# Patient Record
Sex: Male | Born: 1990 | Race: White | Hispanic: No | Marital: Single | Smoking: Never smoker
Health system: Southern US, Community
[De-identification: ages and names within clinical notes are randomized; demographics above are authoritative.]

## PROBLEM LIST (undated history)

## (undated) DIAGNOSIS — L0291 Cutaneous abscess, unspecified: Secondary | ICD-10-CM

---
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
## 2011-06-08 LAB — STREP A DNA PROBE: Group A Strep Probe: NEGATIVE

## 2014-05-10 ENCOUNTER — Encounter (HOSPITAL_COMMUNITY): Payer: Self-pay | Admitting: Emergency Medicine

## 2014-05-10 ENCOUNTER — Ambulatory Visit (HOSPITAL_COMMUNITY)
Admission: EM | Admit: 2014-05-10 | Discharge: 2014-05-11 | Disposition: A | Discharge status: Home / Self Care | Payer: Self-pay | Attending: Emergency Medicine | Admitting: Emergency Medicine

## 2014-05-10 DIAGNOSIS — L02511 Cutaneous abscess of right hand: Secondary | ICD-10-CM

## 2014-05-10 DIAGNOSIS — Y998 Other external cause status: Secondary | ICD-10-CM | POA: Insufficient documentation

## 2014-05-10 DIAGNOSIS — L03019 Cellulitis of unspecified finger: Principal | ICD-10-CM

## 2014-05-10 DIAGNOSIS — L02519 Cutaneous abscess of unspecified hand: Secondary | ICD-10-CM | POA: Insufficient documentation

## 2014-05-10 DIAGNOSIS — Y9389 Activity, other specified: Secondary | ICD-10-CM | POA: Insufficient documentation

## 2014-05-10 DIAGNOSIS — Y92009 Unspecified place in unspecified non-institutional (private) residence as the place of occurrence of the external cause: Secondary | ICD-10-CM | POA: Insufficient documentation

## 2014-05-10 LAB — CBC WITH DIFFERENTIAL/PLATELET
BASOS PCT: 0 % (ref 0–1)
Basophils Absolute: 0 10*3/uL (ref 0.0–0.1)
EOS PCT: 1 % (ref 0–5)
Eosinophils Absolute: 0.1 10*3/uL (ref 0.0–0.7)
HEMATOCRIT: 42.9 % (ref 39.0–52.0)
HEMOGLOBIN: 14.4 g/dL (ref 13.0–17.0)
Lymphocytes Relative: 13 % (ref 12–46)
Lymphs Abs: 1.4 10*3/uL (ref 0.7–4.0)
MCH: 31.5 pg (ref 26.0–34.0)
MCHC: 33.6 g/dL (ref 30.0–36.0)
MCV: 93.9 fL (ref 78.0–100.0)
MONO ABS: 1 10*3/uL (ref 0.1–1.0)
MONOS PCT: 9 % (ref 3–12)
NEUTROS ABS: 7.9 10*3/uL — AB (ref 1.7–7.7)
Neutrophils Relative %: 77 % (ref 43–77)
Platelets: 168 10*3/uL (ref 150–400)
RBC: 4.57 MIL/uL (ref 4.22–5.81)
RDW: 11.9 % (ref 11.5–15.5)
WBC: 10.3 10*3/uL (ref 4.0–10.5)

## 2014-05-10 LAB — COMPREHENSIVE METABOLIC PANEL
ALBUMIN: 3.7 g/dL (ref 3.5–5.2)
ALT: 16 U/L (ref 0–53)
ANION GAP: 11 (ref 5–15)
AST: 20 U/L (ref 0–37)
Alkaline Phosphatase: 74 U/L (ref 39–117)
BILIRUBIN TOTAL: 0.7 mg/dL (ref 0.3–1.2)
BUN: 14 mg/dL (ref 6–23)
CHLORIDE: 101 meq/L (ref 96–112)
CO2: 28 meq/L (ref 19–32)
CREATININE: 1.06 mg/dL (ref 0.50–1.35)
Calcium: 8.8 mg/dL (ref 8.4–10.5)
GFR calc Af Amer: >90 mL/min (ref >90)
Glucose, Bld: 85 mg/dL (ref 70–99)
Potassium: 4.2 mEq/L (ref 3.7–5.3)
Sodium: 140 mEq/L (ref 137–147)
Total Protein: 7.1 g/dL (ref 6.0–8.3)

## 2014-05-10 MED ORDER — MORPHINE SULFATE 4 MG/ML IJ SOLN
4.0000 mg | Freq: Once | INTRAMUSCULAR | Status: AC
  Administered 2014-05-10: 4 mg via INTRAVENOUS
  Filled 2014-05-10: qty 1

## 2014-05-10 MED ORDER — SODIUM CHLORIDE 0.9 % IV BOLUS (SEPSIS)
1000.0000 mL | Freq: Once | INTRAVENOUS | Status: AC
  Administered 2014-05-10: 1000 mL via INTRAVENOUS

## 2014-05-10 NOTE — ED Provider Notes (Signed)
CSN: [id number]     Arrival date & time 05/10/14  1947 History   First MD Initiated Contact with Patient 05/10/14 2255     Chief Complaint  Patient presents with  . Wound Infection     (Consider location/radiation/quality/duration/timing/severity/associated sxs/prior Treatment) HPI Comments: 23 year old male presents to the emergency department for further evaluation of pain to his right index finger. Patient states that he believes he was bit by a spider 5 days ago. He states he went to urgent care 2 days ago who placed the patient on Bactrim. He states he has been taking his antibiotics for 48 hours if no improvement. Patient does note he took a "sterile needle" and punctured his right index finger. He endorses some purulent drainage from the site, but states that redness and swelling worsened shortly after this. He has been taking motrin for pain control without relief. He denies fever, red streaking, numbness, weakness, and pallor to distal R index finger. Tetanus last in 2012. Patient is R hand dominant.  The history is provided by the patient. No language interpreter was used.    History reviewed. No pertinent past medical history. History reviewed. No pertinent past surgical history. No family history on file. History  Substance Use Topics  . Smoking status: Never Smoker   . Smokeless tobacco: Not on file  . Alcohol Use: No    Review of Systems  Constitutional: Negative for fever.  Musculoskeletal: Positive for myalgias.  Skin: Positive for color change and wound.  Neurological: Negative for weakness and numbness.  All other systems reviewed and are negative.    Allergies  Review of patient's allergies indicates no known allergies.  Home Medications   Prior to Admission medications   Medication Sig Start Date End Date Taking? Authorizing Provider  ibuprofen (ADVIL,MOTRIN) 800 MG tablet Take 800 mg by mouth 3 (three) times daily as needed for mild pain.   Yes Historical  Provider, MD  sulfamethoxazole-trimethoprim (BACTRIM DS) 800-160 MG per tablet Take 1 tablet by mouth every 12 (twelve) hours.   Yes Historical Provider, MD   BP 124/51  Pulse 85  Temp(Src) 98.8 F (37.1 C) (Oral)  Resp 14  Ht _0  (1.803 m)  Wt 192 lb (87.091 kg)  BMI 26.79 kg/m2  SpO2 100%  Physical Exam  Nursing note and vitals reviewed. Constitutional: He is oriented to person, place, and time. He appears well-developed and well-nourished. No distress.  Nontoxic/nonseptic appearing  HENT:  Head: Normocephalic and atraumatic.  Eyes: Conjunctivae and EOM are normal. No scleral icterus.  Neck: Normal range of motion.  Cardiovascular: Normal rate, regular rhythm and intact distal pulses.   Distal radial pulse 2+ in RUE. Capillary refill brisk in all digits of R hand.  Pulmonary/Chest: Effort normal. No respiratory distress.  Musculoskeletal: He exhibits tenderness.  Near full PROM of R index finger with limited AROM secondary to swelling and pain. Patient with swelling and erythema from MCP joint of 2nd digit of R hand extending to PIP joint. Punctate area weeping purulent drainage. Area is warm and tender to touch. See images below.  Neurological: He is alert and oriented to person, place, and time. He exhibits normal muscle tone. Coordination normal.  No gross sensory deficits appreciated.  Skin: Skin is warm and dry. No rash noted. He is not diaphoretic. No erythema. No pallor.  Psychiatric: He has a normal mood and affect. His behavior is normal.    ED Course  Procedures (including critical care time) Labs Review Labs  Reviewed  CBC WITH DIFFERENTIAL - Abnormal; Notable for the following:    Neutro Abs 7.9 (*)    All other components within normal limits  COMPREHENSIVE METABOLIC PANEL  SEDIMENTATION RATE  C-REACTIVE PROTEIN   Imaging Review No results found.   EKG Interpretation None         MDM   Final diagnoses:  Abscess of finger of right hand     23 year old male presents to the emergency department for worsening redness and swelling of his right index finger. Physical exam findings consistent with abscess of right index finger. Patient neurovascularly intact. ESR and CRP pending. No leukocytosis. Case discussed with orthopedic hand surgeon, Dr. Fredna Dow, who will see the patient; patient to require OR I&D for abscess. Will withhold Unasyn as cultures to be taken during OR I&D.   Filed Vitals:   05/10/14 2020 05/10/14 2244 05/10/14 2300 05/10/14 2342  BP: 129/98 141/86 130/78 124/51  Pulse: 75 83 86 85  Temp: 98.1 F (36.7 C) 98.8 F (37.1 C)    TempSrc: Oral Oral    Resp: 18 14    Height: _0  (1.803 m)     Weight: 192 lb (87.091 kg)     SpO2: 100% 100% 100% 100%       Antonietta Breach, PA-C 05/11/14 0016

## 2014-05-10 NOTE — ED Notes (Signed)
Pt. Reports spider bite x5 days ago to right index finger. States that he went to urgent care and was put on abx but it is not helping. Finger red, swollen and warm to touch with drainage at puncture site, hand is also swollen and warm to touch.

## 2014-05-10 NOTE — ED Notes (Signed)
The pt was bitten by a spider 5 days ago.  He was  Seen at [facility]  2 days ago.  He was given antibiotics which he is still taking.   Rt index finger was the bite site.  He has redness with swelling and this am  The pt was aqueezinf the area and getting drainage from it.  The swelling is worse and more inflammed now

## 2014-05-10 NOTE — ED Notes (Signed)
Kelly PA at bedside with pt and family, explained time frame and plan for pt.

## 2014-05-11 ENCOUNTER — Emergency Department (HOSPITAL_COMMUNITY): Payer: Self-pay | Admitting: Certified Registered"

## 2014-05-11 ENCOUNTER — Encounter (HOSPITAL_COMMUNITY): Payer: Self-pay | Admitting: Certified Registered"

## 2014-05-11 ENCOUNTER — Encounter (HOSPITAL_COMMUNITY)
Admission: EM | Disposition: A | Discharge status: Home / Self Care | Payer: Self-pay | Source: Home / Self Care | Attending: Emergency Medicine

## 2014-05-11 HISTORY — PX: I&D EXTREMITY: SHX5045

## 2014-05-11 LAB — C-REACTIVE PROTEIN: CRP: 3.8 mg/dL — ABNORMAL HIGH (ref <0.60)

## 2014-05-11 LAB — SEDIMENTATION RATE: SED RATE: 7 mm/h (ref 0–16)

## 2014-05-11 SURGERY — IRRIGATION AND DEBRIDEMENT EXTREMITY
Anesthesia: Regional | Site: Finger | Laterality: Right

## 2014-05-11 MED ORDER — PROPOFOL 10 MG/ML IV BOLUS
INTRAVENOUS | Status: AC
  Filled 2014-05-11: qty 20

## 2014-05-11 MED ORDER — FENTANYL CITRATE 0.05 MG/ML IJ SOLN: 25.0000 ug | INTRAMUSCULAR | Status: DC | PRN

## 2014-05-11 MED ORDER — VANCOMYCIN HCL IN DEXTROSE 1-5 GM/200ML-% IV SOLN
INTRAVENOUS | Status: AC
  Filled 2014-05-11: qty 200

## 2014-05-11 MED ORDER — SODIUM CHLORIDE 0.9 % IR SOLN: Status: DC | PRN

## 2014-05-11 MED ORDER — BUPIVACAINE HCL (PF) 0.25 % IJ SOLN
INTRAMUSCULAR | Status: AC
  Filled 2014-05-11: qty 30

## 2014-05-11 MED ORDER — SODIUM CHLORIDE 0.9 % IJ SOLN
INTRAMUSCULAR | Status: AC
  Filled 2014-05-11: qty 10

## 2014-05-11 MED ORDER — ONDANSETRON HCL 4 MG/2ML IJ SOLN
INTRAMUSCULAR | Status: DC | PRN
  Administered 2014-05-11: 4 mg via INTRAVENOUS

## 2014-05-11 MED ORDER — PROPOFOL 10 MG/ML IV BOLUS
INTRAVENOUS | Status: DC | PRN
  Administered 2014-05-11: 30 mg via INTRAVENOUS

## 2014-05-11 MED ORDER — DEXAMETHASONE SODIUM PHOSPHATE 4 MG/ML IJ SOLN
INTRAMUSCULAR | Status: AC
  Filled 2014-05-11: qty 1

## 2014-05-11 MED ORDER — ONDANSETRON HCL 4 MG/2ML IJ SOLN
INTRAMUSCULAR | Status: AC
  Filled 2014-05-11: qty 2

## 2014-05-11 MED ORDER — MIDAZOLAM HCL 2 MG/2ML IJ SOLN
INTRAMUSCULAR | Status: AC
  Filled 2014-05-11: qty 2

## 2014-05-11 MED ORDER — OXYCODONE-ACETAMINOPHEN 5-325 MG PO TABS
ORAL_TABLET | ORAL | Status: DC
Start: 2014-05-11 — End: 2014-07-26

## 2014-05-11 MED ORDER — LIDOCAINE HCL (CARDIAC) 20 MG/ML IV SOLN
INTRAVENOUS | Status: AC
  Filled 2014-05-11: qty 5

## 2014-05-11 MED ORDER — MIDAZOLAM HCL 2 MG/2ML IJ SOLN
INTRAMUSCULAR | Status: DC | PRN
  Administered 2014-05-11: 2 mg via INTRAVENOUS

## 2014-05-11 MED ORDER — SULFAMETHOXAZOLE-TRIMETHOPRIM 800-160 MG PO TABS: 1.0000 | ORAL_TABLET | Freq: Two times a day (BID) | ORAL | Status: AC

## 2014-05-11 MED ORDER — VANCOMYCIN HCL IN DEXTROSE 1-5 GM/200ML-% IV SOLN
1000.0000 mg | Freq: Once | INTRAVENOUS | Status: AC
  Administered 2014-05-11: 1000 mg via INTRAVENOUS
  Filled 2014-05-11 (×2): qty 200

## 2014-05-11 MED ORDER — LACTATED RINGERS IV SOLN
INTRAVENOUS | Status: DC | PRN
  Administered 2014-05-11: 02:00:00 via INTRAVENOUS

## 2014-05-11 MED ORDER — SODIUM CHLORIDE 0.9 % IR SOLN
Status: DC | PRN
  Administered 2014-05-11: 1000 mL

## 2014-05-11 MED ORDER — 0.9 % SODIUM CHLORIDE (POUR BTL) OPTIME
TOPICAL | Status: DC | PRN
  Administered 2014-05-11: 1000 mL

## 2014-05-11 MED ORDER — SUFENTANIL CITRATE 50 MCG/ML IV SOLN
INTRAVENOUS | Status: AC
  Filled 2014-05-11: qty 1

## 2014-05-11 MED ORDER — ROPIVACAINE HCL 5 MG/ML IJ SOLN
INTRAMUSCULAR | Status: DC | PRN
  Administered 2014-05-11: 20 mL via PERINEURAL

## 2014-05-11 MED ORDER — SUFENTANIL CITRATE 50 MCG/ML IV SOLN
INTRAVENOUS | Status: DC | PRN
Start: 2014-05-11 — End: 2014-05-11
  Administered 2014-05-11: 10 ug via INTRAVENOUS

## 2014-05-11 MED ORDER — LIDOCAINE HCL (CARDIAC) 20 MG/ML IV SOLN
INTRAVENOUS | Status: DC | PRN
Start: 2014-05-11 — End: 2014-05-11
  Administered 2014-05-11: 60 mg via INTRAVENOUS

## 2014-05-11 MED ORDER — DEXAMETHASONE SODIUM PHOSPHATE 4 MG/ML IJ SOLN
INTRAMUSCULAR | Status: DC | PRN
  Administered 2014-05-11: 4 mg via INTRAVENOUS

## 2014-05-11 MED ORDER — KETOROLAC TROMETHAMINE 30 MG/ML IJ SOLN
30.0000 mg | Freq: Once | INTRAMUSCULAR | Status: AC
  Administered 2014-05-11: 30 mg via INTRAVENOUS
  Filled 2014-05-11: qty 1

## 2014-05-11 SURGICAL SUPPLY — 60 items
BANDAGE COBAN STERILE 2 (GAUZE/BANDAGES/DRESSINGS) IMPLANT
BANDAGE ELASTIC 3 VELCRO ST LF (GAUZE/BANDAGES/DRESSINGS) ×6 IMPLANT
BANDAGE ELASTIC 4 VELCRO ST LF (GAUZE/BANDAGES/DRESSINGS) ×3 IMPLANT
BNDG COHESIVE 1X5 TAN STRL LF (GAUZE/BANDAGES/DRESSINGS) IMPLANT
BNDG CONFORM 2 STRL LF (GAUZE/BANDAGES/DRESSINGS) IMPLANT
BNDG CONFORM 3 STRL LF (GAUZE/BANDAGES/DRESSINGS) ×3 IMPLANT
BNDG ESMARK 4X9 LF (GAUZE/BANDAGES/DRESSINGS) ×3 IMPLANT
BNDG GAUZE ELAST 4 BULKY (GAUZE/BANDAGES/DRESSINGS) ×6 IMPLANT
CORDS BIPOLAR (ELECTRODE) ×3 IMPLANT
COVER SURGICAL LIGHT HANDLE (MISCELLANEOUS) ×3 IMPLANT
DECANTER SPIKE VIAL GLASS SM (MISCELLANEOUS) ×3 IMPLANT
DRAIN PENROSE 1/4X12 LTX STRL (WOUND CARE) IMPLANT
DRAPE SURG 17X23 STRL (DRAPES) ×3 IMPLANT
DRSG ADAPTIC 3X8 NADH LF (GAUZE/BANDAGES/DRESSINGS) IMPLANT
DRSG EMULSION OIL 3X3 NADH (GAUZE/BANDAGES/DRESSINGS) ×3 IMPLANT
DRSG PAD ABDOMINAL 8X10 ST (GAUZE/BANDAGES/DRESSINGS) ×6 IMPLANT
GAUZE IODOFORM PACK 1/2 7832 (GAUZE/BANDAGES/DRESSINGS) ×3 IMPLANT
GAUZE SPONGE 4X4 12PLY STRL (GAUZE/BANDAGES/DRESSINGS) ×6 IMPLANT
GAUZE XEROFORM 1X8 LF (GAUZE/BANDAGES/DRESSINGS) ×3 IMPLANT
GLOVE BIO SURGEON STRL SZ7.5 (GLOVE) ×6 IMPLANT
GLOVE BIOGEL PI IND STRL 7.5 (GLOVE) ×1 IMPLANT
GLOVE BIOGEL PI IND STRL 8 (GLOVE) ×1 IMPLANT
GLOVE BIOGEL PI INDICATOR 7.5 (GLOVE) ×2
GLOVE BIOGEL PI INDICATOR 8 (GLOVE) ×2
GLOVE SURG SS PI 7.5 STRL IVOR (GLOVE) ×3 IMPLANT
GOWN STRL REIN XL XLG (GOWN DISPOSABLE) ×3 IMPLANT
HANDPIECE INTERPULSE COAX TIP (DISPOSABLE)
KIT BASIN OR (CUSTOM PROCEDURE TRAY) ×3 IMPLANT
KIT ROOM TURNOVER OR (KITS) ×3 IMPLANT
LOOP VESSEL MAXI BLUE (MISCELLANEOUS) IMPLANT
LOOP VESSEL MINI RED (MISCELLANEOUS) IMPLANT
MANIFOLD NEPTUNE II (INSTRUMENTS) ×3 IMPLANT
NEEDLE HYPO 25X1 1.5 SAFETY (NEEDLE) IMPLANT
NS IRRIG 1000ML POUR BTL (IV SOLUTION) ×3 IMPLANT
PACK ORTHO EXTREMITY (CUSTOM PROCEDURE TRAY) ×3 IMPLANT
PAD ARMBOARD 7.5X6 YLW CONV (MISCELLANEOUS) ×6 IMPLANT
PAD CAST 4YDX4 CTTN HI CHSV (CAST SUPPLIES) ×1 IMPLANT
PADDING CAST COTTON 4X4 STRL (CAST SUPPLIES) ×2
SCRUB BETADINE 4OZ XXX (MISCELLANEOUS) ×3 IMPLANT
SET CYSTO W/LG BORE CLAMP LF (SET/KITS/TRAYS/PACK) ×3 IMPLANT
SET HNDPC FAN SPRY TIP SCT (DISPOSABLE) IMPLANT
SOLUTION BETADINE 4OZ (MISCELLANEOUS) ×3 IMPLANT
SPLINT PLASTER CAST XFAST 3X15 (CAST SUPPLIES) ×1 IMPLANT
SPLINT PLASTER XTRA FASTSET 3X (CAST SUPPLIES) ×2
SPONGE LAP 18X18 X RAY DECT (DISPOSABLE) ×3 IMPLANT
SPONGE LAP 4X18 X RAY DECT (DISPOSABLE) ×3 IMPLANT
SUCTION FRAZIER TIP 10 FR DISP (SUCTIONS) ×3 IMPLANT
SUT ETHILON 4 0 PS 2 18 (SUTURE) ×3 IMPLANT
SUT MON AB 5-0 P3 18 (SUTURE) IMPLANT
SWAB COLLECTION DEVICE MRSA (MISCELLANEOUS) ×3 IMPLANT
SYR CONTROL 10ML LL (SYRINGE) IMPLANT
TOWEL OR 17X24 6PK STRL BLUE (TOWEL DISPOSABLE) ×3 IMPLANT
TOWEL OR 17X26 10 PK STRL BLUE (TOWEL DISPOSABLE) ×3 IMPLANT
TUBE ANAEROBIC SPECIMEN COL (MISCELLANEOUS) ×3 IMPLANT
TUBE CONNECTING 12'X1/4 (SUCTIONS) ×1
TUBE CONNECTING 12X1/4 (SUCTIONS) ×2 IMPLANT
TUBE FEEDING 5FR 15 INCH (TUBING) IMPLANT
UNDERPAD 30X30 INCONTINENT (UNDERPADS AND DIAPERS) ×3 IMPLANT
WATER STERILE IRR 1000ML POUR (IV SOLUTION) ×3 IMPLANT
YANKAUER SUCT BULB TIP NO VENT (SUCTIONS) ×3 IMPLANT

## 2014-05-11 NOTE — Transfer of Care (Signed)
Immediate Anesthesia Transfer of Care Note  Patient: Alex Bird  Procedure(s) Performed: Procedure(s): IRRIGATION AND DEBRIDEMENT RIGHT INDEX FINGER (Right)  Patient Location: PACU  Anesthesia Type:MAC combined with regional for post-op pain  Level of Consciousness: awake, alert , oriented and patient cooperative  Airway & Oxygen Therapy: Patient Spontanous Breathing  Post-op Assessment: Report given to PACU RN, Post -op Vital signs reviewed and stable and Moving 3 ext.  Block right arm not moving  Post vital signs: Reviewed  Complications: No apparent anesthesia complications

## 2014-05-11 NOTE — Brief Op Note (Signed)
05/10/2014 - 05/11/2014  3:03 AM  PATIENT:  Alex Bird  23 y.o. male  PRE-OPERATIVE DIAGNOSIS:  infected right index finger  POST-OPERATIVE DIAGNOSIS:  infected right index finger  PROCEDURE:  Procedure(s): IRRIGATION AND DEBRIDEMENT RIGHT INDEX FINGER (Right)  SURGEON:  Surgeon(s) and Role:    * Betha Loa, MD - Primary  PHYSICIAN ASSISTANT:   ASSISTANTS: none   ANESTHESIA:   regional  EBL:     BLOOD ADMINISTERED:none  DRAINS: iodoform packing  LOCAL MEDICATIONS USED:  NONE  SPECIMEN:  Source of Specimen:  right index finger  DISPOSITION OF SPECIMEN:  micro  COUNTS:  YES  TOURNIQUET:   Total Tourniquet Time Documented: Upper Arm (Right) - 18 minutes Total: Upper Arm (Right) - 18 minutes   DICTATION: .Other Dictation: Dictation Number [id number]  PLAN OF CARE: Discharge to home after PACU  PATIENT DISPOSITION:  PACU - hemodynamically stable.

## 2014-05-11 NOTE — Discharge Instructions (Signed)

## 2014-05-11 NOTE — H&P (Signed)
Alex Bird is an 23 y.o. male.   Chief Complaint: right index infection HPI: 23 yo rhd male states he was bitten by spider on right index finger ~ 5 days ago.  Began to have swelling and erythema of finger.  Seen at UC 2 days ago and started on bactrim.  Finger has been worsening today.  Reports no previous injury to finger and no other issue at this time.  No fevers, chills, night sweats.  History reviewed. No pertinent past medical history.  History reviewed. No pertinent past surgical history.  No family history on file. Social History:  reports that he has never smoked. He does not have any smokeless tobacco history on file. He reports that he does not drink alcohol. His drug history is not on file.  Allergies: No Known Allergies   (Not in a hospital admission)  Results for orders placed during the hospital encounter of 05/10/14 (from the past 48 hour(s))  CBC WITH DIFFERENTIAL     Status: Abnormal   Collection Time    05/10/14  8:34 PM      Result Value Ref Range   WBC 10.3  4.0 - 10.5 K/uL   RBC 4.57  4.22 - 5.81 MIL/uL   Hemoglobin 14.4  13.0 - 17.0 g/dL   HCT 42.9  39.0 - 52.0 %   MCV 93.9  78.0 - 100.0 fL   MCH 31.5  26.0 - 34.0 pg   MCHC 33.6  30.0 - 36.0 g/dL   RDW 11.9  11.5 - 15.5 %   Platelets 168  150 - 400 K/uL   Neutrophils Relative % 77  43 - 77 %   Neutro Abs 7.9 (*) 1.7 - 7.7 K/uL   Lymphocytes Relative 13  12 - 46 %   Lymphs Abs 1.4  0.7 - 4.0 K/uL   Monocytes Relative 9  3 - 12 %   Monocytes Absolute 1.0  0.1 - 1.0 K/uL   Eosinophils Relative 1  0 - 5 %   Eosinophils Absolute 0.1  0.0 - 0.7 K/uL   Basophils Relative 0  0 - 1 %   Basophils Absolute 0.0  0.0 - 0.1 K/uL  COMPREHENSIVE METABOLIC PANEL     Status: None   Collection Time    05/10/14  8:34 PM      Result Value Ref Range   Sodium 140  137 - 147 mEq/L   Potassium 4.2  3.7 - 5.3 mEq/L   Chloride 101  96 - 112 mEq/L   CO2 28  19 - 32 mEq/L   Glucose, Bld 85  70 - 99 mg/dL   BUN 14  6  - 23 mg/dL   Creatinine, Ser 1.06  0.50 - 1.35 mg/dL   Calcium 8.8  8.4 - 10.5 mg/dL   Total Protein 7.1  6.0 - 8.3 g/dL   Albumin 3.7  3.5 - 5.2 g/dL   AST 20  0 - 37 U/L   ALT 16  0 - 53 U/L   Alkaline Phosphatase 74  39 - 117 U/L   Total Bilirubin 0.7  0.3 - 1.2 mg/dL   GFR calc non Af Amer >90  >90 mL/min   GFR calc Af Amer >90  >90 mL/min   Comment: (NOTE)     The eGFR has been calculated using the CKD EPI equation.     This calculation has not been validated in all clinical situations.     eGFR's persistently <90 mL/min signify  possible Chronic Kidney     Disease.   Anion gap 11  5 - 15  SEDIMENTATION RATE     Status: None   Collection Time    05/10/14 11:38 PM      Result Value Ref Range   Sed Rate 7  0 - 16 mm/hr    No results found.   A comprehensive review of systems was negative.  Blood pressure 148/72, pulse 88, temperature 98.8 F (37.1 C), temperature source Oral, resp. rate 14, height 5' 11"  (1.803 m), weight 87.091 kg (192 lb), SpO2 100.00%.  General appearance: alert, cooperative and appears stated age Head: Normocephalic, without obvious abnormality, atraumatic Neck: supple, symmetrical, trachea midline Resp: clear to auscultation bilaterally Cardio: regular rate and rhythm GI: non tender Extremities: intact sensation and capillary refill all digits.  +epl/fpl/io.  right index with proximal erythema and draining wound.  purulent drainage.  no proximal streaking.  pain with motion of finger.  minimal pain volarly.  digit swollen. Pulses: 2+ and symmetric Skin: Skin color, texture, turgor normal. No rashes or lesions Neurologic: Grossly normal Incision/Wound: Purulent drainage from wound at proximal phalanx right index finger.  Assessment/Plan Right index finger abscess.  Recommend OR for incision and drainage of abscess.  Risks, benefits, and alternatives of surgery were discussed and the patient agrees with the plan of care.   Kamilya Wakeman  R 05/11/2014, 1:18 AM

## 2014-05-11 NOTE — Anesthesia Postprocedure Evaluation (Signed)
  Anesthesia Post-op Note  Patient: Alex Bird  Procedure(s) Performed: Procedure(s): IRRIGATION AND DEBRIDEMENT RIGHT INDEX FINGER (Right)  Patient Location: PACU  Anesthesia Type:MAC and Regional  Level of Consciousness: awake  Airway and Oxygen Therapy: Patient Spontanous Breathing  Post-op Pain: none  Post-op Assessment: Post-op Vital signs reviewed, Patient's Cardiovascular Status Stable, Respiratory Function Stable, Patent Airway, No signs of Nausea or vomiting and Pain level controlled  Post-op Vital Signs: Reviewed and stable  Last Vitals:  Filed Vitals:   05/11/14 0355  BP: 119/69  Pulse: 89  Temp: 36.7 C  Resp: 15    Complications: No apparent anesthesia complications

## 2014-05-11 NOTE — Op Note (Signed)
Alex Bird, Alex Bird NO.:  [id number]  MEDICAL RECORD NO.:  [id number]  LOCATION:  [facility]                         FACILITY:  [facility]  PHYSICIAN:  Betha Loa, MD        DATE OF BIRTH:  07-16-1991  DATE OF PROCEDURE:  05/11/2014 DATE OF DISCHARGE:                              OPERATIVE REPORT   PREOPERATIVE DIAGNOSIS:  Right index finger infection with abscess.  POSTOPERATIVE DIAGNOSIS:  Right index finger infection with abscess.  PROCEDURE:  Incision and drainage, right index finger abscess.  SURGEON:  Betha Loa, MD  ASSISTANT:  None.  ANESTHESIA:  Regional IV fluids per anesthesia flow sheet.  ESTIMATED BLOOD LOSS:  Minimal.  COMPLICATIONS:  None.  SPECIMENS:  Cultures from right index finger to Micro.  TOURNIQUET TIME:  18 minutes.  DISPOSITION:  Stable to PACU.  INDICATIONS:  Alex Bird is a 23 year old male who approximately 5 days ago states he was bitten by a spider.  He began to have swelling and erythema of the index finger.  Two days ago, he was started on Bactrim by urgent care facility.  The morning of September 4th, he began to have increasingly worsening swelling and pain in the index finger.  He presented to the emergency department.  I was consulted for management of injury.  He had wound draining purulence.  Recommended going to the operating room for incision and drainage of the index finger abscess. Risks, benefits, and alternatives of surgery were discussed including the risk of blood loss, infection, damage to nerves, vessels, tendons, ligaments, bone; failure of surgery; need for additional surgery, complications with wound healing, continued pain, continued infection, need for repeat irrigation and debridement.  He voiced understanding of these risks and elected to proceed.  OPERATIVE COURSE:  After being identified preoperatively by myself, the patient and I agreed upon procedure and site of procedure.  Surgical site was  marked.  The risks, benefits, and alternatives of surgery were reviewed and he wished to proceed.  Surgical consent had been signed. He was given a regional block in the preoperative holding room by Anesthesia.  He was transported to the operating room placed on the operating table in a supine position with the right upper extremity on arm board.  The right upper extremity was prepped and draped in normal sterile orthopedic fashion.  Surgical pause was performed between surgeons, anesthesia, and operating staff, and all were in agreement as to the patient, procedure, and site of procedure.  Tourniquet at the proximal aspect of the extremity was inflated to 250 mmHg after exsanguination of the limb with an Esmarch bandage.  The hand was exsanguinated via gravity.  Incision was made on the dorsum of the index finger including the draining wound.  It was carried into the subcutaneous tissues by spreading technique.  Gross purulence was encountered.  Cultures were taken for aerobes, anaerobes, and Gram stain.  The abscess cavity was defined.  There was some devitalized fat. The purulence and devitalized tissue were removed.  The infection did not appear to course volarly or underneath the tendon.  The wound was copiously irrigated with 2000 mL of sterile saline by cysto tubing and  bulb syringe.  It was debrided using the curette.  The draining sinus was excised sharply with the knife including the skin.  The wound was then packed with quarter-inch iodoform gauze and dressed with sterile 4x4s and wrapped with a Kerlix bandage.  A volar slab splint including the index, long, and ring fingers was placed and wrapped with Kerlix and Ace bandage.  The tourniquet was deflated at 18 minutes.  Fingertips were pink with brisk capillary refill after deflation of the tourniquet. Operative drapes were broken down.  The patient was awoken from anesthesia safely.  He was transferred back to the stretcher and  taken to PACU in stable condition.  I will see him back in the office early next week for postoperative followup.  I will give him Percocet 5/325 one to two p.o. q.6 hours p.r.n. pain, dispensed #40, and Bactrim DS 1 p.o. b.i.d. x10 days.     Betha Loa, MD     KK/MEDQ  D:  05/11/2014  T:  05/11/2014  Job:  [id number]

## 2014-05-11 NOTE — Anesthesia Procedure Notes (Addendum)
Anesthesia Regional Block:  Supraclavicular block  Pre-Anesthetic Checklist: ,, timeout performed, Correct Patient, Correct Site, Correct Laterality, Correct Procedure, Correct Position, site marked, Risks and benefits discussed,  Surgical consent,  Pre-op evaluation,  At surgeon's request and post-op pain management  Laterality: Upper and Right  Prep: chloraprep       Needles:  Injection technique: Single-shot  Needle Type: Echogenic Needle          Additional Needles:  Procedures: ultrasound guided (picture in chart) Supraclavicular block Narrative:  Start time: 05/11/2014 2:21 AM End time: 05/11/2014 2:28 AM Injection made incrementally with aspirations every 5 mL.  Performed by: Personally  Anesthesiologist: Moser  Additional Notes: H+P and labs reviewed, risks and benefits discussed with patient, procedure tolerated well without complications   Procedure Name: MAC Date/Time: 05/11/2014 2:30 AM Performed by: Melina Schools Pre-anesthesia Checklist: Patient identified, Emergency Drugs available, Suction available, Patient being monitored and Timeout performed Patient Re-evaluated:Patient Re-evaluated prior to inductionOxygen Delivery Method: Simple face mask

## 2014-05-11 NOTE — Op Note (Signed)
[id number] 

## 2014-05-11 NOTE — Anesthesia Preprocedure Evaluation (Addendum)
Anesthesia Evaluation  Patient identified by MRN, date of birth, ID band Patient awake    Reviewed: Allergy & Precautions, H&P , NPO status , Patient's Chart, lab work & pertinent test results  History of Anesthesia Complications Negative for: history of anesthetic complications  Airway Mallampati: I TM Distance: >3 FB Neck ROM: Full    Dental  (+) Teeth Intact   Pulmonary neg pulmonary ROS,  breath sounds clear to auscultation        Cardiovascular negative cardio ROS  Rhythm:Regular     Neuro/Psych negative neurological ROS  negative psych ROS   GI/Hepatic negative GI ROS, Neg liver ROS,   Endo/Other  negative endocrine ROS  Renal/GU negative Renal ROS     Musculoskeletal   Abdominal   Peds  Hematology negative hematology ROS (+)   Anesthesia Other Findings   Reproductive/Obstetrics                           Anesthesia Physical Anesthesia Plan  ASA: I  Anesthesia Plan: Regional   Post-op Pain Management:    Induction:   Airway Management Planned: Natural Airway and Simple Face Mask  Additional Equipment: None  Intra-op Plan:   Post-operative Plan:   Informed Consent: I have reviewed the patients History and Physical, chart, labs and discussed the procedure including the risks, benefits and alternatives for the proposed anesthesia with the patient or authorized representative who has indicated his/her understanding and acceptance.   Dental advisory given  Plan Discussed with: CRNA and Surgeon  Anesthesia Plan Comments:         Anesthesia Quick Evaluation

## 2014-05-11 NOTE — ED Notes (Signed)
Dr Kuzma at bedside. 

## 2014-05-12 NOTE — ED Provider Notes (Signed)
Medical screening examination/treatment/procedure(s) were performed by non-physician practitioner and as supervising physician I was immediately available for consultation/collaboration.   EKG Interpretation None        Margene Cherian, MD 05/12/14 0658 

## 2014-05-14 ENCOUNTER — Encounter (HOSPITAL_COMMUNITY): Payer: Self-pay | Admitting: Orthopedic Surgery

## 2014-05-14 LAB — CULTURE, ROUTINE-ABSCESS

## 2014-05-16 LAB — ANAEROBIC CULTURE

## 2014-07-26 ENCOUNTER — Emergency Department (HOSPITAL_COMMUNITY)
Admission: EM | Admit: 2014-07-26 | Discharge: 2014-07-27 | Disposition: A | Discharge status: Home / Self Care | Payer: MEDICAID | Attending: Emergency Medicine | Admitting: Emergency Medicine

## 2014-07-26 ENCOUNTER — Encounter (HOSPITAL_COMMUNITY): Payer: Self-pay | Admitting: *Deleted

## 2014-07-26 ENCOUNTER — Emergency Department (HOSPITAL_COMMUNITY)
Admission: EM | Admit: 2014-07-26 | Discharge: 2014-07-26 | Discharge status: Against Medical Advice | Payer: Self-pay | Attending: Emergency Medicine | Admitting: Emergency Medicine

## 2014-07-26 ENCOUNTER — Encounter (HOSPITAL_COMMUNITY): Payer: Self-pay | Admitting: Emergency Medicine

## 2014-07-26 ENCOUNTER — Emergency Department (HOSPITAL_COMMUNITY): Payer: Self-pay

## 2014-07-26 DIAGNOSIS — L02512 Cutaneous abscess of left hand: Secondary | ICD-10-CM | POA: Insufficient documentation

## 2014-07-26 DIAGNOSIS — L02519 Cutaneous abscess of unspecified hand: Secondary | ICD-10-CM

## 2014-07-26 DIAGNOSIS — Z792 Long term (current) use of antibiotics: Secondary | ICD-10-CM | POA: Insufficient documentation

## 2014-07-26 DIAGNOSIS — L03114 Cellulitis of left upper limb: Secondary | ICD-10-CM | POA: Insufficient documentation

## 2014-07-26 DIAGNOSIS — Z79899 Other long term (current) drug therapy: Secondary | ICD-10-CM | POA: Insufficient documentation

## 2014-07-26 DIAGNOSIS — L03119 Cellulitis of unspecified part of limb: Secondary | ICD-10-CM

## 2014-07-26 DIAGNOSIS — Z23 Encounter for immunization: Secondary | ICD-10-CM | POA: Insufficient documentation

## 2014-07-26 LAB — BASIC METABOLIC PANEL
Anion gap: 11 (ref 5–15)
BUN: 18 mg/dL (ref 6–23)
CO2: 26 meq/L (ref 19–32)
Calcium: 9.8 mg/dL (ref 8.4–10.5)
Chloride: 101 mEq/L (ref 96–112)
Creatinine, Ser: 1.25 mg/dL (ref 0.50–1.35)
GFR calc Af Amer: >90 mL/min (ref >90)
GFR calc non Af Amer: 81 mL/min — ABNORMAL LOW (ref >90)
GLUCOSE: 86 mg/dL (ref 70–99)
POTASSIUM: 4.4 meq/L (ref 3.7–5.3)
SODIUM: 138 meq/L (ref 137–147)

## 2014-07-26 LAB — CBC WITH DIFFERENTIAL/PLATELET
Basophils Absolute: 0 10*3/uL (ref 0.0–0.1)
Basophils Relative: 0 % (ref 0–1)
Eosinophils Absolute: 0.1 10*3/uL (ref 0.0–0.7)
Eosinophils Relative: 1 % (ref 0–5)
HCT: 47.6 % (ref 39.0–52.0)
HEMOGLOBIN: 16.8 g/dL (ref 13.0–17.0)
LYMPHS ABS: 1.5 10*3/uL (ref 0.7–4.0)
LYMPHS PCT: 16 % (ref 12–46)
MCH: 31.6 pg (ref 26.0–34.0)
MCHC: 35.3 g/dL (ref 30.0–36.0)
MCV: 89.5 fL (ref 78.0–100.0)
MONOS PCT: 9 % (ref 3–12)
Monocytes Absolute: 0.8 10*3/uL (ref 0.1–1.0)
NEUTROS ABS: 6.8 10*3/uL (ref 1.7–7.7)
NEUTROS PCT: 74 % (ref 43–77)
Platelets: 179 10*3/uL (ref 150–400)
RBC: 5.32 MIL/uL (ref 4.22–5.81)
RDW: 11.9 % (ref 11.5–15.5)
WBC: 9.2 10*3/uL (ref 4.0–10.5)

## 2014-07-26 LAB — LACTIC ACID, PLASMA: LACTIC ACID, VENOUS: 0.8 mmol/L (ref 0.5–2.2)

## 2014-07-26 MED ORDER — HYDROCODONE-ACETAMINOPHEN 5-325 MG PO TABS
2.0000 | ORAL_TABLET | Freq: Once | ORAL | Status: AC
  Administered 2014-07-26: 2 via ORAL
  Filled 2014-07-26: qty 2

## 2014-07-26 MED ORDER — VANCOMYCIN HCL IN DEXTROSE 1-5 GM/200ML-% IV SOLN
1000.0000 mg | Freq: Once | INTRAVENOUS | Status: AC
  Administered 2014-07-26: 1000 mg via INTRAVENOUS
  Filled 2014-07-26: qty 200

## 2014-07-26 MED ORDER — TETANUS-DIPHTH-ACELL PERTUSSIS 5-2.5-18.5 LF-MCG/0.5 IM SUSP
0.5000 mL | Freq: Once | INTRAMUSCULAR | Status: AC
  Administered 2014-07-26: 0.5 mL via INTRAMUSCULAR
  Filled 2014-07-26: qty 0.5

## 2014-07-26 MED ORDER — ONDANSETRON HCL 4 MG/2ML IJ SOLN
4.0000 mg | Freq: Once | INTRAMUSCULAR | Status: AC
  Administered 2014-07-27: 4 mg via INTRAVENOUS
  Filled 2014-07-26: qty 2

## 2014-07-26 MED ORDER — SODIUM CHLORIDE 0.9 % IV BOLUS (SEPSIS)
1000.0000 mL | Freq: Once | INTRAVENOUS | Status: AC
  Administered 2014-07-27: 1000 mL via INTRAVENOUS

## 2014-07-26 MED ORDER — CLINDAMYCIN HCL 150 MG PO CAPS: ORAL_CAPSULE | ORAL | Status: AC

## 2014-07-26 MED ORDER — ACETAMINOPHEN 500 MG PO TABS
1000.0000 mg | ORAL_TABLET | Freq: Once | ORAL | Status: AC
  Administered 2014-07-27: 1000 mg via ORAL
  Filled 2014-07-26: qty 2

## 2014-07-26 MED ORDER — ONDANSETRON HCL 4 MG PO TABS
4.0000 mg | ORAL_TABLET | Freq: Once | ORAL | Status: AC
  Administered 2014-07-26: 4 mg via ORAL
  Filled 2014-07-26: qty 1

## 2014-07-26 MED ORDER — OXYCODONE-ACETAMINOPHEN 5-325 MG PO TABS
1.0000 | ORAL_TABLET | Freq: Four times a day (QID) | ORAL | Status: DC | PRN
Start: 2014-07-26 — End: 2016-08-12

## 2014-07-26 MED ORDER — MORPHINE SULFATE 4 MG/ML IJ SOLN
4.0000 mg | Freq: Once | INTRAMUSCULAR | Status: AC
Start: 2014-07-27 — End: 2014-07-27
  Administered 2014-07-27: 4 mg via INTRAVENOUS
  Filled 2014-07-26: qty 1

## 2014-07-26 MED ORDER — CLINDAMYCIN PHOSPHATE 600 MG/50ML IV SOLN
600.0000 mg | Freq: Once | INTRAVENOUS | Status: AC
  Administered 2014-07-27: 600 mg via INTRAVENOUS
  Filled 2014-07-26: qty 50

## 2014-07-26 NOTE — ED Notes (Signed)
Patient began having itching on L hand between thumb and index finger on Monday.  Over the course of the week, the site developed a papule that popped and drained w/subsequent swelling and redness.  He began taking leftover Bactrim from a similar type infection on his R index finger in September.  That infection was treated w/oral antibx, but [facility].  Culture was sent which showed gram + cocci in pairs and clusters.  He was treated again post-procedure with Bactrim.

## 2014-07-26 NOTE — ED Notes (Addendum)
Swelling , redness of lt hand, Had similar sx in Sept and had to have surgery on rt hand.  Pt started taking "left over antibiotics"

## 2014-07-26 NOTE — Care Management Note (Signed)
ED/CM noted patient did not have health insurance and/or PCP listed in the computer.  Patient was given the [place] resource handout with information on the clinics, food pantries, and the handout for new health insurance sign-up. Pt was also given a Rx assistance card. Patient expressed appreciation for information received. 

## 2014-07-26 NOTE — ED Notes (Signed)
Cleaned hand with Saf-Clens.  Applied telfa and kerlix. Covered w/[employer].

## 2014-07-26 NOTE — Discharge Instructions (Signed)
Please use warm tub or basin soaks to the left hand on 2 times daily until resolved. Please use clindamycin 4 times daily, with each meal and at bedtime until all taken. Use Tylenol or ibuprofen for mild discomfort. Use Percocet for more severe pain. Please see Dr.Kuzma for evaluation concerning your hand infection. Cellulitis Cellulitis is an infection of the skin and the tissue under the skin. The infected area is usually red and tender. This happens most often in the arms and lower legs. HOME CARE   Take your antibiotic medicine as told. Finish the medicine even if you start to feel better.  Keep the infected arm or leg raised (elevated).  Put a warm cloth on the area up to 4 times per day.  Only take medicines as told by your doctor.  Keep all doctor visits as told. GET HELP IF:  You see red streaks on the skin coming from the infected area.  Your red area gets bigger or turns a dark color.  Your bone or joint under the infected area is painful after the skin heals.  Your infection comes back in the same area or different area.  You have a puffy (swollen) bump in the infected area.  You have new symptoms.  You have a fever. GET HELP RIGHT AWAY IF:   You feel very sleepy.  You throw up (vomit) or have watery poop (diarrhea).  You feel sick and have muscle aches and pains. MAKE SURE YOU:   Understand these instructions.  Will watch your condition.  Will get help right away if you are not doing well or get worse. Document Released: 02/09/2008 Document Revised: 01/07/2014 Document Reviewed: 11/08/2011 [facility] Patient Information 2015 [employer] [place]. This information is not intended to replace advice given to you by your health care provider. Make sure you discuss any questions you have with your health care provider.

## 2014-07-26 NOTE — ED Notes (Signed)
Patient with no complaints at this time. Respirations even and unlabored. Skin warm/dry. Discharge instructions reviewed with patient at this time. Patient given opportunity to voice concerns/ask questions. IV removed per policy and band-aid applied to site. Patient discharged at this time and left Emergency Department with steady gait.  

## 2014-07-26 NOTE — ED Provider Notes (Signed)
CSN: [id number]     Arrival date & time 07/26/14  1058 History   First MD Initiated Contact with Patient 07/26/14 1129     Chief Complaint  Patient presents with  . Cellulitis     (Consider location/radiation/quality/duration/timing/severity/associated sxs/prior Treatment) HPI Comments: Patient presents to the emergency department with a complaint of swelling and tenderness of the left hand. The patient states that approximately 4 or 5 days ago he felt some itching of the dorsum of his left hand, he states he scratched it, he then noticed a papule that popped and later he noted increased swelling and redness. The patient states he had a similar situation to go on with right hand so he started some Bactrim that he had left over from another infectious episode the area did not seem to be getting better and so he came to the emergency department to be evaluated. The patient denies fever or chills, he denies any red streaks going up the arm. He denies any problems with movement of the fingers, but states that it feels stiff because of the swelling. He's not had any other lesions on during this course. It is of note that in September 2015 the patient developed an infection of the right hand, he stuck a needle in it to attempt to drain it himself, he got a superinfection and required hand surgery and IV antibiotics in order to clear this up. He presents now for assistance with the problem of the left hand.  The history is provided by the patient.    History reviewed. No pertinent past medical history. Past Surgical History  Procedure Laterality Date  . I&d extremity Right 05/11/2014    Procedure: IRRIGATION AND DEBRIDEMENT RIGHT INDEX FINGER;  Surgeon: Betha Loa, MD;  Location: [facility] OR;  Service: Orthopedics;  Laterality: Right;   History reviewed. No pertinent family history. History  Substance Use Topics  . Smoking status: Never Smoker   . Smokeless tobacco: Not on file  . Alcohol Use: No     Review of Systems  Constitutional: Negative for activity change.       All ROS Neg except as noted in HPI  HENT: Negative for nosebleeds.   Eyes: Negative for photophobia and discharge.  Respiratory: Negative for cough, shortness of breath and wheezing.   Cardiovascular: Negative for chest pain and palpitations.  Gastrointestinal: Negative for abdominal pain and blood in stool.  Genitourinary: Negative for dysuria, frequency and hematuria.  Musculoskeletal: Negative for back pain, arthralgias and neck pain.  Skin: Negative.   Neurological: Negative for dizziness, seizures and speech difficulty.  Psychiatric/Behavioral: Negative for hallucinations and confusion.      Allergies  Review of patient's allergies indicates no known allergies.  Home Medications   Prior to Admission medications   Medication Sig Start Date End Date Taking? Authorizing Provider  ibuprofen (ADVIL,MOTRIN) 800 MG tablet Take 800 mg by mouth 3 (three) times daily as needed for mild pain.    Historical Provider, MD  oxyCODONE-acetaminophen (PERCOCET) 5-325 MG per tablet 1-2 tabs po q6 hours prn pain 05/11/14   Betha Loa, MD  sulfamethoxazole-trimethoprim (BACTRIM DS) 800-160 MG per tablet Take 1 tablet by mouth every 12 (twelve) hours.    Historical Provider, MD  sulfamethoxazole-trimethoprim (BACTRIM DS) 800-160 MG per tablet Take 1 tablet by mouth 2 (two) times daily. 05/11/14   Betha Loa, MD   BP 142/65 mmHg  Pulse 79  Temp(Src) 98.7 F (37.1 C) (Oral)  Resp 18  Ht 6' (1.829 m)  Wt 205 lb (92.987 kg)  BMI 27.80 kg/m2  SpO2 98% Physical Exam  Constitutional: He is oriented to person, place, and time. He appears well-developed and well-nourished.  Non-toxic appearance.  HENT:  Head: Normocephalic.  Right Ear: Tympanic membrane and external ear normal.  Left Ear: Tympanic membrane and external ear normal.  Eyes: EOM and lids are normal. Pupils are equal, round, and reactive to light.  Neck: Normal  range of motion. Neck supple. Carotid bruit is not present.  Cardiovascular: Normal rate, regular rhythm, normal heart sounds, intact distal pulses and normal pulses.   Pulmonary/Chest: Breath sounds normal. No respiratory distress.  Abdominal: Soft. Bowel sounds are normal. There is no tenderness. There is no guarding.  Musculoskeletal: Normal range of motion.  There is swelling of the dorsum of the left hand. There is a raised red area near the web space between the first and second fingers of the left hand. There is no active drainage appreciated at this time. There is no red streaking appreciated. There is some increased warmth of the left hand to about the mid forearm. Capillary refill is less than 2 seconds, radial pulse is 2+. The compartments are soft.  Lymphadenopathy:       Head (right side): No submandibular adenopathy present.       Head (left side): No submandibular adenopathy present.    He has no cervical adenopathy.  Neurological: He is alert and oriented to person, place, and time. He has normal strength. No cranial nerve deficit or sensory deficit.  Skin: Skin is warm and dry.  Psychiatric: He has a normal mood and affect. His speech is normal.  Nursing note and vitals reviewed.   ED Course Pt seen with me by Dr Clarene DukeMcManus.  Procedures (including critical care time) Labs Review Labs Reviewed - No data to display  Imaging Review No results found.   EKG Interpretation None      MDM  Vital signs are within normal limits. Pulse oximetry is 98% on room air. Within normal limits by my interpretation. The complete blood count is within normal limits, the lactic acid was in the normal limits. Doubt bacteremia,. Basic metabolic panel is well within normal limits. The kidney function in particular is normal. An x-ray of the left hand is negative for any foreign body, gas, fracture, or bony lesion.  IV vancomycin has been started. Patient seen with me by Dr. Clarene DukeMcManus. Discussed  with the patient the need for admission.  The patient states that he has several thousand dollars in of debt from his previous hospitalization. He does not want to be admitted again because he is not working on her regular basis and cannot afford it. Discuss with the patient with nursing present the importance of having the IV antibiotics and formal orthopedic evaluation inpatient, especially with a hand being involved. The patient acknowledges understanding of the importance of this therapy and understands that this would be the best care for his particular problem. He except the responsibility for not being admitted to the hospital at this time.  Patient placed on clindamycin as his previous culture sensitivity ReFlex that the organism was sensitive to the clindamycin. The patient advised about using warm tub soaks or warm basin soaks to the hand at least once if not twice a day. I've advised the patient about red streaking and advancing an infection. The patient knowledge is understanding of these instructions. He still requests to be discharged home.    Final diagnoses:  None    **  I have reviewed nursing notes, vital signs, and all appropriate lab and imaging results for this patient.Kathie Dike*    Shirlena Brinegar M Terie Lear, PA-C 07/26/14 1517  Samuel JesterKathleen McManus, DO 07/29/14 [phone]

## 2014-07-26 NOTE — ED Notes (Signed)
Pt. reports nausea with low grade fever , seen at [facility] today for his left hand skin infection / cellulitis - received IV antibiotic and was discharged home with oral antibiotic .

## 2014-07-26 NOTE — ED Provider Notes (Signed)
CSN: [id number]     Arrival date & time 07/26/14  2312 History   First MD Initiated Contact with Patient 07/26/14 2337     This chart was scribed for Richardean Canalavid H Tory Mckissack, MD by Arlan OrganAshley Leger, ED Scribe. This patient was seen in room B16C/B16C and the patient's care was started 1:22 AM.   Chief Complaint  Patient presents with  . Cellulitis   The history is provided by the patient. No language interpreter was used.    HPI Comments: Alex Bird is a 23 y.o. male who presents to the Emergency Department complaining of cellulitis to the L hand x 5 days. He also reports swelling and a "hardness" to the area.  He also reports a fever of 101 at its highest. He has not tried any OTC medications for symptoms. He denies any drainage from the area. No cough, nausea, or vomiting. [facility] today and was given IV antibiotics. He was also sent home with a prescription for antibiotic but has not had his first dose yet. Pt was advised to stay for admission, however, he declined. He is otherwise healthy without any medical problems. No known allergies to medications. He had similar symptoms on R hand 2 months ago requiring IV abx and debridement in the OR. Had MRSA at that time.   History reviewed. No pertinent past medical history. Past Surgical History  Procedure Laterality Date  . I&d extremity Right 05/11/2014    Procedure: IRRIGATION AND DEBRIDEMENT RIGHT INDEX FINGER;  Surgeon: Betha LoaKevin Kuzma, MD;  Location: [facility] OR;  Service: Orthopedics;  Laterality: Right;   No family history on file. History  Substance Use Topics  . Smoking status: Never Smoker   . Smokeless tobacco: Not on file  . Alcohol Use: No    Review of Systems  Constitutional: Positive for fever.  Skin: Positive for color change and wound.  All other systems reviewed and are negative.     Allergies  Review of patient's allergies indicates no known allergies.  Home Medications   Prior to Admission medications    Medication Sig Start Date End Date Taking? Authorizing Provider  clindamycin (CLEOCIN) 150 MG capsule 1 po four times daily, with each meal and at hs. Patient taking differently: Take 150 mg by mouth 4 (four) times daily.  07/26/14  Yes Kathie DikeHobson M Bryant, PA-C  oxyCODONE-acetaminophen (PERCOCET/ROXICET) 5-325 MG per tablet Take 1 tablet by mouth every 6 (six) hours as needed. 07/26/14  Yes Kathie DikeHobson M Bryant, PA-C  sulfamethoxazole-trimethoprim (BACTRIM DS) 800-160 MG per tablet Take 1 tablet by mouth 2 (two) times daily. 05/11/14  Yes Betha LoaKevin Kuzma, MD    Triage Vitals: BP 129/73 mmHg  Pulse 92  Temp(Src) 99.9 F (37.7 C) (Oral)  Resp 11  Ht 6' (1.829 m)  Wt 206 lb (93.441 kg)  BMI 27.93 kg/m2  SpO2 99%    Physical Exam  Constitutional: He is oriented to person, place, and time. He appears well-developed and well-nourished.  HENT:  Head: Normocephalic.  Mouth/Throat: Oropharynx is clear and moist.  Eyes: EOM are normal.  Neck: Normal range of motion.  Cardiovascular: Normal rate, regular rhythm and normal heart sounds.   Pulmonary/Chest: Effort normal and breath sounds normal.  Abdominal: He exhibits no distension.  Musculoskeletal: Normal range of motion.  L hand with area of cellulites between first webspace at dorsal aspect of hand Mild fluctuance and swelling to hand 2 plus radial pulses Capillary refill intact  Neurological: He is  alert and oriented to person, place, and time.  Psychiatric: He has a normal mood and affect.  Nursing note and vitals reviewed.   ED Course  Procedures (including critical care time)      DIAGNOSTIC STUDIES:  COORDINATION OF CARE: 1:22 AM- Will perform an I&D. Discussed treatment plan with pt at bedside and pt agreed to plan.    INCISION AND DRAINAGE PROCEDURE NOTE: Patient identification was confirmed and verbal consent was obtained. This procedure was performed by Richardean Canalavid H Zayvian Mcmurtry, MD at 1:22 AM. Site: L hand Sterile procedures  observed Anesthetic used (type and amt): 2 CC of 2% lidocaine without epinephrine  Drainage: Moderate Complexity: Complex Packing used: Yes Site anesthetized, incision made over site, wound drained and explored loculations, rinsed with copious amounts of normal saline, wound packed with sterile gauze, covered with dry, sterile dressing.  Pt tolerated procedure well without complications.  Instructions for care discussed verbally and pt provided with additional written instructions for homecare and f/u.   EMERGENCY DEPARTMENT US SOFT TISSUE INTERPRETATION "Study: Limited Ultrasound of the noted body part in comments below"  INDICATIONS: Soft tissue infection Multiple views of the body part are obtained with a multi-frequency linear probe  PERFORMED BY:  Myself  IMAGES ARCHIVED?: Yes  SIDE:Left  BODY PART:Other soft tisse (comment in note)  FINDINGS: Abcess present and Cellulitis present  LIMITATIONS:  Body Habitus  INTERPRETATION:  Abcess present and Cellulitis present  COMMENT:  L hand cellulitis and abscess    Labs Review Labs Reviewed  BASIC METABOLIC PANEL - Abnormal; Notable for the following:    GFR calc non Af Amer 83 (*)    Anion gap 17 (*)    All other components within normal limits  CULTURE, BLOOD (ROUTINE X 2)  CULTURE, BLOOD (ROUTINE X 2)  WOUND CULTURE  CBC WITH DIFFERENTIAL  I-STAT CG4 LACTIC ACID, ED    Imaging Review Dg Hand Complete Left  07/26/2014   CLINICAL DATA:  Swelling in hand.  Spot on the hand.  EXAM: LEFT HAND - COMPLETE 3+ VIEW  COMPARISON:  None.  FINDINGS: Negative for a fracture or dislocation. Alignment of the left hand is normal. There may be soft tissue swelling along the dorsal aspect of the hand.  IMPRESSION: No acute bone abnormality. No evidence for a radiopaque foreign body.   Electronically Signed   By: Richarda OverlieAdam  Henn M.D.   On: 07/26/2014 13:06     EKG Interpretation None      MDM   Final diagnoses:  None   Alex  L Margo Bird is a 23 y.o. male here with fever, worsening cellulitis. Given that he had previous MRSA on right had requiring I and D and packing in OR, I am concerned for MRSA on L hand. Had fever at home. Seen [facility] and recommend admission. WBC earlier today was nl. Will repeat labs, add lactate and culture. I consulted Dr. Janee Mornhompson, hand surgeon, to see patient in AM.   12:15 AM  Called Dr. Janee Mornhompson, hand surgeon, who recommend bedside I and D and packing.   1:22 AM Bedside US showed abscess. I and D performed. Wound culture sent. Loaded with IV clinda in the ED. Repeat labs unremarkable. Stable for d/c. I told him to be seen in 2 days for follow up.    I personally performed the services described in this documentation, which was scribed in my presence. The recorded information has been reviewed and is accurate.    Richardean Canalavid H Laasya Peyton,  MD 07/27/14 0122

## 2014-07-27 LAB — BASIC METABOLIC PANEL
Anion gap: 17 — ABNORMAL HIGH (ref 5–15)
BUN: 17 mg/dL (ref 6–23)
CALCIUM: 9.4 mg/dL (ref 8.4–10.5)
CO2: 24 mEq/L (ref 19–32)
CREATININE: 1.22 mg/dL (ref 0.50–1.35)
Chloride: 96 mEq/L (ref 96–112)
GFR calc Af Amer: >90 mL/min (ref >90)
GFR, EST NON AFRICAN AMERICAN: 83 mL/min — AB (ref >90)
GLUCOSE: 87 mg/dL (ref 70–99)
POTASSIUM: 4.4 meq/L (ref 3.7–5.3)
Sodium: 137 mEq/L (ref 137–147)

## 2014-07-27 LAB — CBC WITH DIFFERENTIAL/PLATELET
BASOS PCT: 0 % (ref 0–1)
Basophils Absolute: 0 10*3/uL (ref 0.0–0.1)
EOS ABS: 0.1 10*3/uL (ref 0.0–0.7)
Eosinophils Relative: 1 % (ref 0–5)
HCT: 45.9 % (ref 39.0–52.0)
HEMOGLOBIN: 16 g/dL (ref 13.0–17.0)
Lymphocytes Relative: 13 % (ref 12–46)
Lymphs Abs: 1.3 10*3/uL (ref 0.7–4.0)
MCH: 31.6 pg (ref 26.0–34.0)
MCHC: 34.9 g/dL (ref 30.0–36.0)
MCV: 90.5 fL (ref 78.0–100.0)
MONO ABS: 1 10*3/uL (ref 0.1–1.0)
Monocytes Relative: 10 % (ref 3–12)
NEUTROS PCT: 76 % (ref 43–77)
Neutro Abs: 7.5 10*3/uL (ref 1.7–7.7)
Platelets: 155 10*3/uL (ref 150–400)
RBC: 5.07 MIL/uL (ref 4.22–5.81)
RDW: 11.8 % (ref 11.5–15.5)
WBC: 9.9 10*3/uL (ref 4.0–10.5)

## 2014-07-27 LAB — I-STAT CG4 LACTIC ACID, ED: Lactic Acid, Venous: 1.03 mmol/L (ref 0.5–2.2)

## 2014-07-27 MED ORDER — LIDOCAINE HCL 2 % IJ SOLN
10.0000 mL | Freq: Once | INTRAMUSCULAR | Status: AC
  Administered 2014-07-27: 200 mg via INTRADERMAL
  Filled 2014-07-27: qty 20

## 2014-07-27 NOTE — Discharge Instructions (Signed)
Take clinda as prescribed.   Take oxycodone as prescribed.   Follow up in 2 days for wound check.   See Dr. Merlyn LotKuzma next week.   Return to ER if you have fever, severe pain, worse infection.

## 2014-07-28 ENCOUNTER — Encounter (HOSPITAL_COMMUNITY): Payer: Self-pay | Admitting: Emergency Medicine

## 2014-07-28 ENCOUNTER — Emergency Department (HOSPITAL_COMMUNITY)
Admission: EM | Admit: 2014-07-28 | Discharge: 2014-07-28 | Disposition: A | Discharge status: Home / Self Care | Payer: MEDICAID | Attending: Emergency Medicine | Admitting: Emergency Medicine

## 2014-07-28 DIAGNOSIS — R509 Fever, unspecified: Secondary | ICD-10-CM | POA: Insufficient documentation

## 2014-07-28 DIAGNOSIS — Z5189 Encounter for other specified aftercare: Secondary | ICD-10-CM

## 2014-07-28 DIAGNOSIS — Z4801 Encounter for change or removal of surgical wound dressing: Secondary | ICD-10-CM | POA: Insufficient documentation

## 2014-07-28 DIAGNOSIS — Z792 Long term (current) use of antibiotics: Secondary | ICD-10-CM | POA: Insufficient documentation

## 2014-07-28 DIAGNOSIS — Z872 Personal history of diseases of the skin and subcutaneous tissue: Secondary | ICD-10-CM | POA: Insufficient documentation

## 2014-07-28 HISTORY — DX: Cutaneous abscess, unspecified: L02.91

## 2014-07-28 NOTE — Discharge Instructions (Signed)
Wound Check Your wound appears healthy today. Your wound will heal gradually over time. Eventually a scar will form that will fade with time. FACTORS THAT AFFECT SCAR FORMATION:  People differ in the severity in which they scar.  Scar severity varies according to location, size, and the traits you inherited from your parents (genetic predisposition).  Irritation to the wound from infection, rubbing, or chemical exposure will increase the amount of scar formation. HOME CARE INSTRUCTIONS   If you were given a dressing, you should change it at least once a day or as instructed by your caregiver. If the bandage sticks, soak it off with a solution of hydrogen peroxide.  If the bandage becomes wet, dirty, or develops a bad smell, change it as soon as possible.  Look for signs of infection.  Only take over-the-counter or prescription medicines for pain, discomfort, or fever as directed by your caregiver.  Do a warm salt water soak for 10 minutes twice daily, then dry and cover your wound. SEEK IMMEDIATE MEDICAL CARE IF:   You have redness, swelling, or increasing pain in the wound.  You notice pus coming from the wound.  You have a fever.  You notice a bad smell coming from the wound or dressing. Document Released: 05/29/2004 Document Revised: 11/15/2011 Document Reviewed: 08/23/2005 [facility] Patient Information 2015 [employer] [place]. This information is not intended to replace advice given to you by your health care provider. Make sure you discuss any questions you have with your health care provider.

## 2014-07-28 NOTE — ED Notes (Signed)
Patient had I&D of abscess to left hand on Friday [facility]. Patient told to come back today for packing removal and wound recheck. Patient taking oral antibiotics. Patient has swelling in hand. Per fiance patient hand and arm swollen much worse with redness and fevers on Friday, had actually came here first and was going to be admitted for IV antibiotics but decide to go home when he started to get worse and [facility]. Patient denies any fevers now.

## 2014-07-28 NOTE — ED Provider Notes (Signed)
CSN: [id number]     Arrival date & time 07/28/14  1530 History  This chart was scribed for Burgess AmorJulie Rayan Dyal, PA-C with Benny LennertJoseph L Zammit, MD by Tonye RoyaltyJoshua Chen, ED Scribe. This patient was seen in room APFT23/APFT23 and the patient's care was started at 5:41 PM.    Chief Complaint  Patient presents with  . Wound Check   The history is provided by the patient. No language interpreter was used.    HPI Comments: Alex Bird is a 23 y.o. male who presents to the Emergency Department for wound check for an incision and drainage performed on the dorsal aspect of his left hand 2 days ago. He states it is much improved. He notes a fever 2 days ago which has resolved. He notes significant drainage that was mostly blood but also has improved. He states he has been taking his prescribed Clindamycin, which he began taking yesterday. He states he has been wetting it when changing the bandage. He states it began as an itchy spot that he scratched. He states he does not have a PCP and denies chronic health problems. He notes that he had a similar problem 2 months ago to his right hand that was treated by Dr. Merlyn LotKuzma requiring IV antibiotics and debridement in the OR.  Past Medical History  Diagnosis Date  . Abscess    Past Surgical History  Procedure Laterality Date  . I&d extremity Right 05/11/2014    Procedure: IRRIGATION AND DEBRIDEMENT RIGHT INDEX FINGER;  Surgeon: Betha LoaKevin Kuzma, MD;  Location: [facility] OR;  Service: Orthopedics;  Laterality: Right;   History reviewed. No pertinent family history. History  Substance Use Topics  . Smoking status: Never Smoker   . Smokeless tobacco: Never Used  . Alcohol Use: No    Review of Systems  Constitutional: Positive for fever.  HENT: Negative for congestion and sore throat.   Eyes: Negative.   Respiratory: Negative for chest tightness and shortness of breath.   Cardiovascular: Negative for chest pain.  Gastrointestinal: Negative for nausea and abdominal pain.   Genitourinary: Negative.   Musculoskeletal: Negative for joint swelling, arthralgias and neck pain.  Skin: Positive for wound (incision and drainage site). Negative for rash.  Neurological: Negative for dizziness, weakness, light-headedness, numbness and headaches.  Psychiatric/Behavioral: Negative.       Allergies  Review of patient's allergies indicates no known allergies.  Home Medications   Prior to Admission medications   Medication Sig Start Date End Date Taking? Authorizing Provider  clindamycin (CLEOCIN) 150 MG capsule 1 po four times daily, with each meal and at hs. Patient taking differently: Take 150 mg by mouth 4 (four) times daily.  07/26/14  Yes Kathie DikeHobson M Bryant, PA-C  oxyCODONE-acetaminophen (PERCOCET/ROXICET) 5-325 MG per tablet Take 1 tablet by mouth every 6 (six) hours as needed. 07/26/14  Yes Kathie DikeHobson M Bryant, PA-C  sulfamethoxazole-trimethoprim (BACTRIM DS) 800-160 MG per tablet Take 1 tablet by mouth 2 (two) times daily. Patient not taking: Reported on 07/28/2014 05/11/14   Betha LoaKevin Kuzma, MD   BP 141/63 mmHg  Pulse 70  Temp(Src) 98 F (36.7 C) (Oral)  Resp 16  Ht 6' (1.829 m)  Wt 210 lb (95.255 kg)  BMI 28.47 kg/m2  SpO2 97% Physical Exam  Constitutional: He appears well-developed and well-nourished.  HENT:  Head: Normocephalic and atraumatic.  Eyes: Conjunctivae are normal.  Neck: Normal range of motion.  Cardiovascular: Normal rate, regular rhythm, normal heart sounds and intact distal pulses.   Pulmonary/Chest: Effort normal  and breath sounds normal. He has no wheezes.  Abdominal: Soft. Bowel sounds are normal. There is no tenderness.  Musculoskeletal: Normal range of motion.  Neurological: He is alert.  Skin: Skin is warm and dry.  Much improved infection in comparison to the photo from his previous ED visit Minimal left hand dorsal edema  No red streaking, no purulent drainage Packing in place Distal sensation intact  Psychiatric: He has a normal  mood and affect.  Nursing note and vitals reviewed.   ED Course  Procedures (including critical care time)  DIAGNOSTIC STUDIES: Oxygen Saturation is 97% on room air, normal by my interpretation.    COORDINATION OF CARE: 5:50 PM Discussed treatment plan with patient at beside, including soaking and then removal of the packing with follow up as previously determined. The patient agrees with the plan and has no further questions at this time.   Labs Review Labs Reviewed - No data to display  Imaging Review No results found.   EKG Interpretation None      MDM   Final diagnoses:  Wound check, abscess    Infection appears to be responding well to the abx prescribed.  Packing removed, encouraged warm salt water soaks bid, followed by dry dressing.  F/u with Dr. Merlyn LotKuzma as planned, he is to call in the am to arrange appt.  In the interim,  Advised return here for any worsened sx, fever, etc.  I personally performed the services described in this documentation, which was scribed in my presence. The recorded information has been reviewed and is accurate.   Burgess AmorJulie Recie Cirrincione, PA-C 07/29/14 1238  Benny LennertJoseph L Zammit, MD 08/05/14 [phone]

## 2014-07-29 ENCOUNTER — Telehealth (HOSPITAL_BASED_OUTPATIENT_CLINIC_OR_DEPARTMENT_OTHER): Payer: Self-pay

## 2014-07-29 LAB — WOUND CULTURE

## 2014-07-29 NOTE — ED Notes (Signed)
spoke with pt. informed of lab results. educated on MRSA

## 2014-07-30 ENCOUNTER — Telehealth (HOSPITAL_BASED_OUTPATIENT_CLINIC_OR_DEPARTMENT_OTHER): Payer: Self-pay | Admitting: Emergency Medicine

## 2014-07-30 NOTE — Telephone Encounter (Signed)
Post ED Visit - Positive Culture Follow-up  Culture report reviewed by antimicrobial stewardship pharmacist: []  Wes Dulaney, Pharm.D., BCPS []  Celedonio MiyamotoJeremy Frens, Pharm.D., BCPS []  Georgina PillionElizabeth Martin, Pharm.D., BCPS []  HamlinMinh Pham, [address]Ph[address]D., BCPS, AAHIVP []  Estella HuskMichelle Turner, Pharm.D., BCPS, AAHIVP [x]  Babs BertinHaley Baird, [address]Ph[address]   Positive wound culture MRSA Treated with clindamycin, organism sensitive to the same and no further patient follow-up is required at this time.  Berle MullMiller, Thimothy Barretta 07/30/2014, 10:22 AM

## 2014-08-02 LAB — CULTURE, BLOOD (ROUTINE X 2)
Culture: NO GROWTH
Culture: NO GROWTH

## 2016-08-09 ENCOUNTER — Emergency Department (HOSPITAL_COMMUNITY)
Admission: EM | Admit: 2016-08-09 | Discharge: 2016-08-09 | Disposition: A | Discharge status: Home / Self Care | Payer: No Typology Code available for payment source | Attending: Emergency Medicine | Admitting: Emergency Medicine

## 2016-08-09 ENCOUNTER — Emergency Department (HOSPITAL_COMMUNITY): Payer: No Typology Code available for payment source

## 2016-08-09 ENCOUNTER — Encounter (HOSPITAL_COMMUNITY): Payer: Self-pay | Admitting: Emergency Medicine

## 2016-08-09 DIAGNOSIS — Y999 Unspecified external cause status: Secondary | ICD-10-CM | POA: Insufficient documentation

## 2016-08-09 DIAGNOSIS — Y9389 Activity, other specified: Secondary | ICD-10-CM | POA: Insufficient documentation

## 2016-08-09 DIAGNOSIS — Y92410 Unspecified street and highway as the place of occurrence of the external cause: Secondary | ICD-10-CM | POA: Diagnosis not present

## 2016-08-09 DIAGNOSIS — S199XXA Unspecified injury of neck, initial encounter: Secondary | ICD-10-CM | POA: Diagnosis present

## 2016-08-09 DIAGNOSIS — S39012A Strain of muscle, fascia and tendon of lower back, initial encounter: Secondary | ICD-10-CM | POA: Diagnosis not present

## 2016-08-09 DIAGNOSIS — S161XXA Strain of muscle, fascia and tendon at neck level, initial encounter: Secondary | ICD-10-CM | POA: Diagnosis not present

## 2016-08-09 MED ORDER — CYCLOBENZAPRINE HCL 10 MG PO TABS: 10.0000 mg | ORAL_TABLET | Freq: Three times a day (TID) | ORAL | 0 refills | Status: AC

## 2016-08-09 MED ORDER — HYDROCODONE-ACETAMINOPHEN 5-325 MG PO TABS
2.0000 | ORAL_TABLET | Freq: Once | ORAL | Status: AC
  Administered 2016-08-09: 2 via ORAL
  Filled 2016-08-09: qty 2

## 2016-08-09 MED ORDER — IBUPROFEN 800 MG PO TABS: 800.0000 mg | ORAL_TABLET | Freq: Three times a day (TID) | ORAL | 0 refills | Status: DC

## 2016-08-09 MED ORDER — IBUPROFEN 800 MG PO TABS
800.0000 mg | ORAL_TABLET | Freq: Once | ORAL | Status: AC
  Administered 2016-08-09: 800 mg via ORAL
  Filled 2016-08-09: qty 1

## 2016-08-09 MED ORDER — DIAZEPAM 5 MG PO TABS
10.0000 mg | ORAL_TABLET | Freq: Once | ORAL | Status: AC
  Administered 2016-08-09: 10 mg via ORAL
  Filled 2016-08-09: qty 2

## 2016-08-09 MED ORDER — ONDANSETRON HCL 4 MG PO TABS
4.0000 mg | ORAL_TABLET | Freq: Once | ORAL | Status: AC
  Administered 2016-08-09: 4 mg via ORAL
  Filled 2016-08-09: qty 1
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
## 2016-08-09 NOTE — Discharge Instructions (Signed)
Your vital signs are within normal limits. Your oxygen level is 100% on room air. The CT scan of your neck and the base of the skull are negative for fracture or dislocation. The x-ray of your lower back is negative for fracture or dislocation. Your examination favors multiple areas of muscle strain. Please use Flexeril and ibuprofen 3 times daily with food. Flexeril may cause drowsiness, please do not drink alcohol, drive a vehicle, operating machinery, or participate in activities requiring concentration when taking this particular medication. Please see Dr. Romeo AppleHarrison for orthopedic evaluation if not improving.

## 2016-08-12 ENCOUNTER — Encounter: Payer: Self-pay | Admitting: Orthopaedic Surgery

## 2016-08-12 ENCOUNTER — Ambulatory Visit (INDEPENDENT_AMBULATORY_CARE_PROVIDER_SITE_OTHER): Payer: Self-pay | Admitting: Orthopaedic Surgery

## 2016-08-12 VITALS — BP 118/85 | HR 81 | Temp 97.7°F | Ht 73.0 in | Wt 259.0 lb

## 2016-08-12 DIAGNOSIS — M5416 Radiculopathy, lumbar region: Secondary | ICD-10-CM

## 2016-08-12 DIAGNOSIS — M542 Cervicalgia: Secondary | ICD-10-CM

## 2016-08-12 DIAGNOSIS — M25562 Pain in left knee: Secondary | ICD-10-CM

## 2016-08-12 DIAGNOSIS — M5417 Radiculopathy, lumbosacral region: Secondary | ICD-10-CM

## 2016-08-12 MED ORDER — PREDNISONE 5 MG (21) PO TBPK: ORAL_TABLET | ORAL | 0 refills | Status: AC

## 2016-08-12 MED ORDER — OXYCODONE-ACETAMINOPHEN 5-325 MG PO TABS: ORAL_TABLET | ORAL | 0 refills | Status: DC

## 2016-08-12 MED ORDER — NAPROXEN 500 MG PO TABS: 500.0000 mg | ORAL_TABLET | Freq: Two times a day (BID) | ORAL | 5 refills | Status: AC

## 2016-08-12 NOTE — Patient Instructions (Signed)
Out of work note
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
## 2016-08-26 ENCOUNTER — Encounter: Payer: Self-pay | Admitting: Orthopaedic Surgery

## 2016-08-26 ENCOUNTER — Ambulatory Visit (INDEPENDENT_AMBULATORY_CARE_PROVIDER_SITE_OTHER): Payer: Self-pay | Admitting: Orthopaedic Surgery

## 2016-08-26 VITALS — BP 118/81 | HR 74 | Temp 97.7°F | Ht 74.0 in | Wt 262.0 lb

## 2016-08-26 DIAGNOSIS — M5417 Radiculopathy, lumbosacral region: Secondary | ICD-10-CM

## 2016-08-26 DIAGNOSIS — M5416 Radiculopathy, lumbar region: Secondary | ICD-10-CM

## 2016-08-26 DIAGNOSIS — M542 Cervicalgia: Secondary | ICD-10-CM

## 2016-08-26 DIAGNOSIS — M25562 Pain in left knee: Secondary | ICD-10-CM

## 2016-08-26 NOTE — Progress Notes (Signed)
Patient ZO:XWRUEAVW:Alex Bird, male DOB:[birth year], 25 y.o. UJW:[id number]  Chief Complaint  Patient presents with  . Follow-up    low back pain    HPI  Alex Bird is a 25 y.o. male who has lower back pain.  He was seen two weeks ago.  He is no better.  There was a mix-up and he did not pick up his Naprosyn or prednisone dose pack.  I have gone over this again.  He will get it today.  He is to stop the ibuprofen.  He was set up to have PT of the back but they did not make an appointment for him until January, a full month from the request.  I told him that this is not acceptable.  I was unaware of this and will investigate.  He is to remain off work.   HPI  Body mass index is 33.64 kg/m.  ROS  Review of Systems  HENT: Negative for congestion.   Respiratory: Negative for cough and shortness of breath.   Cardiovascular: Negative for chest pain and leg swelling.  Endocrine: Negative for cold intolerance.  Musculoskeletal: Positive for arthralgias, back pain and myalgias.  Allergic/Immunologic: Negative for environmental allergies.    Past Medical History:  Diagnosis Date  . Abscess     Past Surgical History:  Procedure Laterality Date  . I&D EXTREMITY Right 05/11/2014   Procedure: IRRIGATION AND DEBRIDEMENT RIGHT INDEX FINGER;  Surgeon: Betha LoaKevin Kuzma, MD;  Location: [facility] OR;  Service: Orthopedics;  Laterality: Right;    No family history on file.  Social History Social History  Substance Use Topics  . Smoking status: Never Smoker  . Smokeless tobacco: Never Used  . Alcohol use No    No Known Allergies  Current Outpatient Prescriptions  Medication Sig Dispense Refill  . clindamycin (CLEOCIN) 150 MG capsule 1 po four times daily, with each meal and at hs. (Patient taking differently: Take 150 mg by mouth 4 (four) times daily. ) 28 capsule 0  . cyclobenzaprine (FLEXERIL) 10 MG tablet Take 1 tablet (10 mg total) by mouth 3 (three) times daily. 20 tablet 0  .  naproxen (NAPROSYN) 500 MG tablet Take 1 tablet (500 mg total) by mouth 2 (two) times daily with a meal. 60 tablet 5  . oxyCODONE-acetaminophen (PERCOCET/ROXICET) 5-325 MG tablet One tablet every six hours for pain. 56 tablet 0  . predniSONE (STERAPRED UNI-PAK 21 TAB) 5 MG (21) TBPK tablet Take 6 pills first day; 5 pills second day; 4 pills third day; 3 pills fourth day; 2 pills next day and 1 pill last day. 21 tablet 0  . sulfamethoxazole-trimethoprim (BACTRIM DS) 800-160 MG per tablet Take 1 tablet by mouth 2 (two) times daily. (Patient not taking: Reported on 07/28/2014) 20 tablet 0   No current facility-administered medications for this visit.      Physical Exam  Blood pressure 118/81, pulse 74, temperature 97.7 F (36.5 C), height 6\' 2"  (1.88 m), weight 262 lb (118.8 kg).  Constitutional: overall normal hygiene, normal nutrition, well developed, normal grooming, normal body habitus. Assistive device:none  Musculoskeletal: gait and station Limp none, muscle tone and strength are normal, no tremors or atrophy is present.  .  Neurological: coordination overall normal.  Deep tendon reflex/nerve stretch intact.  Sensation normal.  Cranial nerves II-XII intact.   Skin:   Normal overall no scars, lesions, ulcers or rashes. No psoriasis.  Psychiatric: Alert and oriented x 3.  Recent memory intact, remote memory unclear.  Normal  mood and affect. Well groomed.  Good eye contact.  Cardiovascular: overall no swelling, no varicosities, no edema bilaterally, normal temperatures of the legs and arms, no clubbing, cyanosis and good capillary refill.  Lymphatic: palpation is normal.  Spine/Pelvis examination:  Inspection:  Overall, sacoiliac joint benign and hips nontender; without crepitus or defects.   Thoracic spine inspection: Alignment normal without kyphosis present   Lumbar spine inspection:  Alignment  with normal lumbar lordosis, without scoliosis apparent.   Thoracic spine  palpation:  without tenderness of spinal processes   Lumbar spine palpation: with tenderness of lumbar area; without tightness of lumbar muscles    Range of Motion:   Lumbar flexion, forward flexion is 35 without pain or tenderness    Lumbar extension is 10 without pain or tenderness   Left lateral bend is Normal  without pain or tenderness   Right lateral bend is Normal without pain or tenderness   Straight leg raising is Normal   Strength & tone: Normal   Stability overall normal stability     The patient has been educated about the nature of the problem(s) and counseled on treatment options.  The patient appeared to understand what I have discussed and is in agreement with it.  Encounter Diagnoses  Name Primary?  . Lumbar back pain with radiculopathy affecting left lower extremity Yes  . Acute pain of left knee   . Neck pain, acute      PLAN Call if any problems.  Precautions discussed.  Continue current medications.   Return to clinic 2 weeks   Electronically Signed Darreld McleanWayne Koury Roddy, MD 12/21/20179:53 AM

## 2016-09-08 ENCOUNTER — Ambulatory Visit (HOSPITAL_COMMUNITY): Payer: BLUE CROSS/BLUE SHIELD | Attending: Orthopaedic Surgery | Admitting: Physical Therapy

## 2016-09-09 ENCOUNTER — Ambulatory Visit (INDEPENDENT_AMBULATORY_CARE_PROVIDER_SITE_OTHER): Payer: Self-pay | Admitting: Orthopaedic Surgery

## 2016-09-09 ENCOUNTER — Encounter: Payer: Self-pay | Admitting: Orthopaedic Surgery

## 2016-09-09 VITALS — Temp 97.7°F | Ht 74.0 in | Wt 262.0 lb

## 2016-09-09 DIAGNOSIS — M5416 Radiculopathy, lumbar region: Secondary | ICD-10-CM

## 2016-09-09 DIAGNOSIS — M5417 Radiculopathy, lumbosacral region: Secondary | ICD-10-CM

## 2016-09-09 MED ORDER — OXYCODONE-ACETAMINOPHEN 5-325 MG PO TABS: ORAL_TABLET | ORAL | 0 refills | Status: AC

## 2016-09-09 NOTE — Progress Notes (Signed)
Patient ZO:XWRUEAVW:Alex Bird, male DOB:May 31, 1991, 26 y.o. UJW:[id number]  Chief Complaint  Patient presents with  . Back Pain    HPI  Alex GunningJonathon L Bird is a 26 y.o. male who has lower back pain with left sided sciatica.  He is worse with the cold weather.  He has no new trauma. He has no bowel or bladder problems. HPI  Body mass index is 33.64 kg/m.  ROS  Review of Systems  HENT: Negative for congestion.   Respiratory: Negative for cough and shortness of breath.   Cardiovascular: Negative for chest pain and leg swelling.  Endocrine: Negative for cold intolerance.  Musculoskeletal: Positive for arthralgias, back pain and myalgias.  Allergic/Immunologic: Negative for environmental allergies.    Past Medical History:  Diagnosis Date  . Abscess     Past Surgical History:  Procedure Laterality Date  . I&D EXTREMITY Right 05/11/2014   Procedure: IRRIGATION AND DEBRIDEMENT RIGHT INDEX FINGER;  Surgeon: Betha LoaKevin Kuzma, MD;  Location: [facility] OR;  Service: Orthopedics;  Laterality: Right;    No family history on file.  Social History Social History  Substance Use Topics  . Smoking status: Never Smoker  . Smokeless tobacco: Never Used  . Alcohol use No    No Known Allergies  Current Outpatient Prescriptions  Medication Sig Dispense Refill  . clindamycin (CLEOCIN) 150 MG capsule 1 po four times daily, with each meal and at hs. (Patient taking differently: Take 150 mg by mouth 4 (four) times daily. ) 28 capsule 0  . cyclobenzaprine (FLEXERIL) 10 MG tablet Take 1 tablet (10 mg total) by mouth 3 (three) times daily. 20 tablet 0  . naproxen (NAPROSYN) 500 MG tablet Take 1 tablet (500 mg total) by mouth 2 (two) times daily with a meal. 60 tablet 5  . oxyCODONE-acetaminophen (PERCOCET/ROXICET) 5-325 MG tablet One tablet every six hours for pain. 56 tablet 0  . predniSONE (STERAPRED UNI-PAK 21 TAB) 5 MG (21) TBPK tablet Take 6 pills first day; 5 pills second day; 4 pills third day; 3 pills  fourth day; 2 pills next day and 1 pill last day. 21 tablet 0  . sulfamethoxazole-trimethoprim (BACTRIM DS) 800-160 MG per tablet Take 1 tablet by mouth 2 (two) times daily. (Patient not taking: Reported on 07/28/2014) 20 tablet 0   No current facility-administered medications for this visit.      Physical Exam  Temperature 97.7 F (36.5 C), height 6\' 2"  (1.88 m), weight 262 lb (118.8 kg).  Constitutional: overall normal hygiene, normal nutrition, well developed, normal grooming, normal body habitus. Assistive device:none  Musculoskeletal: gait and station Limp none, muscle tone and strength are normal, no tremors or atrophy is present.  .  Neurological: coordination overall normal.  Deep tendon reflex/nerve stretch intact.  Sensation normal.  Cranial nerves II-XII intact.   Skin:   Normal overall no scars, lesions, ulcers or rashes. No psoriasis.  Psychiatric: Alert and oriented x 3.  Recent memory intact, remote memory unclear.  Normal mood and affect. Well groomed.  Good eye contact.  Cardiovascular: overall no swelling, no varicosities, no edema bilaterally, normal temperatures of the legs and arms, no clubbing, cyanosis and good capillary refill.  Lymphatic: palpation is normal.  Spine/Pelvis examination:  Inspection:  Overall, sacoiliac joint benign and hips nontender; without crepitus or defects.   Thoracic spine inspection: Alignment normal without kyphosis present   Lumbar spine inspection:  Alignment  with normal lumbar lordosis, without scoliosis apparent.   Thoracic spine palpation:  without tenderness of  spinal processes   Lumbar spine palpation: with tenderness of lumbar area; without tightness of lumbar muscles    Range of Motion:   Lumbar flexion, forward flexion is 45 without pain or tenderness    Lumbar extension is 10 without pain or tenderness   Left lateral bend is Normal  without pain or tenderness   Right lateral bend is Normal without pain or  tenderness   Straight leg raising is Normal   Strength & tone: Normal   Stability overall normal stability     The patient has been educated about the nature of the problem(s) and counseled on treatment options.  The patient appeared to understand what I have discussed and is in agreement with it.  Encounter Diagnosis  Name Primary?  . Lumbar back pain with radiculopathy affecting left lower extremity Yes    PLAN Call if any problems.  Precautions discussed.  Continue current medications.  Rx for pain medicine given after reviewing the state narcotic site Return to clinic 3 months  He needs a MRI of his back. He does not have insurance and cannot afford it.  Electronically Signed Darreld Mclean, MD 1/4/20189:29 AM

## 2016-09-23 ENCOUNTER — Ambulatory Visit (HOSPITAL_COMMUNITY): Payer: Self-pay | Admitting: Physical Therapy

## 2016-09-24 ENCOUNTER — Telehealth: Payer: Self-pay | Admitting: Orthopaedic Surgery

## 2016-09-24 ENCOUNTER — Encounter: Payer: Self-pay | Admitting: Orthopaedic Surgery

## 2016-09-24 ENCOUNTER — Telehealth: Payer: Self-pay | Admitting: Radiology

## 2016-09-24 ENCOUNTER — Ambulatory Visit: Payer: Self-pay | Admitting: Orthopaedic Surgery

## 2016-09-24 NOTE — Telephone Encounter (Signed)
I spoke with the patient and scheduled a return apt here for 10/12/16 so we can document he has had 8 weeks of conservative treatment in order to get an MRI approved thru [employer].

## 2016-09-24 NOTE — Telephone Encounter (Signed)
Patient asking to go forward with MRI; states has insurance - [employer] - said effective 09/06/16; relayed would first need to present insurance card for verification.  Please advise if he may be set up for MRI, if approved, or await next scheduled appointment in April?  Patient's ph# [phone]

## 2016-09-24 NOTE — Telephone Encounter (Signed)
Spoke with him and gave him an apt for here for 10/12/16 and discussed the lack of therapy.  He has not been able to go for various reasions.  He will call them back today and make sure he goes before this apt.

## 2016-09-28 ENCOUNTER — Encounter: Payer: Self-pay | Admitting: Orthopaedic Surgery

## 2016-09-30 ENCOUNTER — Ambulatory Visit (HOSPITAL_COMMUNITY): Payer: BLUE CROSS/BLUE SHIELD | Admitting: Physical Therapy

## 2016-10-07 ENCOUNTER — Ambulatory Visit: Payer: Self-pay | Admitting: Orthopaedic Surgery

## 2016-10-12 ENCOUNTER — Ambulatory Visit: Payer: Self-pay | Admitting: Orthopaedic Surgery

## 2016-10-13 ENCOUNTER — Ambulatory Visit: Payer: Self-pay | Admitting: Orthopaedic Surgery

## 2016-10-27 ENCOUNTER — Ambulatory Visit (HOSPITAL_COMMUNITY): Payer: BLUE CROSS/BLUE SHIELD | Attending: Orthopaedic Surgery

## 2016-10-27 DIAGNOSIS — M5442 Lumbago with sciatica, left side: Secondary | ICD-10-CM | POA: Insufficient documentation

## 2016-10-27 DIAGNOSIS — R262 Difficulty in walking, not elsewhere classified: Secondary | ICD-10-CM

## 2016-10-27 DIAGNOSIS — M6281 Muscle weakness (generalized): Secondary | ICD-10-CM | POA: Insufficient documentation
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
## 2016-10-28 ENCOUNTER — Telehealth (HOSPITAL_COMMUNITY): Payer: Self-pay

## 2016-10-28 ENCOUNTER — Encounter (HOSPITAL_COMMUNITY): Payer: BLUE CROSS/BLUE SHIELD

## 2016-10-28 NOTE — Telephone Encounter (Signed)
Pt was called to duty for the [employer] and is out of town he called today @ 4:19 after his drill to let me know why he was a no show. NF 10/28/16

## 2016-10-28 NOTE — Telephone Encounter (Signed)
Pt did not show for appointment today, nor did he contact the office. PT called cell number listed in EMR and left a voicemail. This is the patient's first no-show.   3:57 PM, 10/28/16 Rosamaria LintsAllan C Jissel Slavens, PT, DPT [facility]ist [facility] [phone] (office)

## 2016-11-02 ENCOUNTER — Ambulatory Visit (HOSPITAL_COMMUNITY): Payer: BLUE CROSS/BLUE SHIELD

## 2016-11-02 DIAGNOSIS — M5442 Lumbago with sciatica, left side: Secondary | ICD-10-CM | POA: Diagnosis not present

## 2016-11-02 DIAGNOSIS — M6281 Muscle weakness (generalized): Secondary | ICD-10-CM

## 2016-11-02 DIAGNOSIS — R262 Difficulty in walking, not elsewhere classified: Secondary | ICD-10-CM

## 2016-11-02 NOTE — Therapy (Signed)
[place], [place], [address] Phone: [phone]   Fax:  [phone]  Physical Therapy Treatment  Patient Details  Name: Alex Bird MRN: [id number] Date of Birth: Dec 23, 1990 Referring Provider: Sanjuana Kava   Encounter Date: 11/02/2016      PT End of Session - 11/02/16 1624    Visit Number 2   Number of Visits 18   Date for PT Re-Evaluation 11/17/16   Authorization Type BCBS/ other    Authorization Time Period 10/27/16-11/23/16   PT Start Time 1618  Pt late for apt   PT Stop Time 1646   PT Time Calculation (min) 28 min   Activity Tolerance Patient tolerated treatment well;Patient limited by pain   Behavior During Therapy [facility] for tasks assessed/performed      Past Medical History:  Diagnosis Date  . Abscess     Past Surgical History:  Procedure Laterality Date  . I&D EXTREMITY Right 05/11/2014   Procedure: IRRIGATION AND DEBRIDEMENT RIGHT INDEX FINGER;  Surgeon: Leanora Cover, MD;  Location: [facility];  Service: Orthopedics;  Laterality: Right;    There were no vitals filed for this visit.      Subjective Assessment - 11/02/16 1613    Subjective Pt reports compliance with HEP daily with no questions concerning exercise.  Reports continues to have constant pain from Lt side lower back with radicular symptoms down to top of Lt ankle.  Current pain scale 6/10 today.   Pertinent History No history of prior low back pain.   Patient Stated Goals Fully resolve pain   Currently in Pain? Yes   Pain Score 6    Pain Location Back   Pain Orientation Left   Pain Descriptors / Indicators Sharp  Pulling, sharp radicular symptoms.   Pain Type Acute pain   Pain Radiating Towards Lt LE down to top of Lt ankle   Pain Onset More than a month ago   Pain Frequency Intermittent   Aggravating Factors  prolonged sitting, being still, leg lifts during work outs   Pain Relieving Factors seated toe reach, hamstring strech                          [facility] Adult PT Treatment/Exercise - 11/02/16 0001      Exercises   Exercises Lumbar     Lumbar Exercises: Supine   Ab Set 10 reps;5 seconds   AB Set Limitations cueing for proper contraction and continue breathing   Bent Knee Raise 5 reps;5 seconds   Bridge 5 reps   Bridge Limitations Lt foot closer following MET   Straight Leg Raise 5 reps  Rt LE only following MEt     Manual Therapy   Manual Therapy Muscle Energy Technique   Manual therapy comments Manual complete separate rest of tx   Muscle Energy Technique MET for Lt SI anterior rotation f/b core strengthening therex                PT Education - 11/02/16 1720    Education provided Yes   Education Details Reviewed goals, compliance with HEP, copy of eval given to pt.  Educated on purpose, procedure and alignment with MET    Person(s) Educated Patient   Methods Explanation;Demonstration;Handout;Verbal cues   Comprehension Verbalized understanding;Returned demonstration;Tactile cues required;Need further instruction          PT Short Term Goals - 10/27/16 1519      PT  SHORT TERM GOAL #1   Title After 3 weeks patient will demonstrate 3 ways he can manage his LLE symptoms and pain at home, and will report consistent compliance with HEP.    Status New     PT SHORT TERM GOAL #2   Title After 3 weeks patient will demonstrate tolerance of 3MWT averaging gait speed of 1.62ms    Status New     PT SHORT TERM GOAL #3   Title After 3 weeks patient will demonstrate improved strength AEB MMT grade improvement of 1/2 grade or higher.    Status New           PT Long Term Goals - 10/27/16 2038      PT LONG TERM GOAL #1   Title After 6 weeks patient will demonstrate improved tolerance to mobility AEB ability to run for 5 minutes without increase in pain.    Status New     PT LONG TERM GOAL #2   Title After 6 weeks patient will demonstrate 5/5 strength in all BLE muscle  groups tested at evaluation.    Status New     PT LONG TERM GOAL #3   Title After 6 weeks patient will demonstrate improved lateral hip function AEB single leg stance balance with eyes closed >10 seconds, and eyes open > 60seconds bilat.    Status New               Plan - 11/02/16 1656    Clinical Impression Statement Reviewed goals, assured compliance iwth HEP and copy of eval given to pt.  Began session assessing SI alignment with noted significant anterior rotation Lt sided.  Muscle energy technique complete with vast improvements within alignment.  Core strengthening exercises complete following MET to assist with alignment.  Pt reports increased pain to 7/10 following MET, also reports decreased radicular symptoms ending above ankle rather than down to foot.  Explained body alignment and symptoms following MET, pt verbalized understanding.  Pt encouraged to continue wiht HEP and f/u next session with symptoms.     Rehab Potential Good   Clinical Impairments Affecting Rehab Potential Still working light duty as a mDealerfull time.    PT Frequency 3x / week   PT Duration 6 weeks   PT Treatment/Interventions Moist Heat;Prosthetic Training;Dry needling;Traction;Gait training;Functional mobility training;Therapeutic exercise;Therapeutic activities;Balance training;Patient/family education;Passive range of motion;Manual techniques;Energy conservation   PT Next Visit Plan Next session f/u with symptoms (radicular and pain) with MET; assess SI alignment with MET PRN, gluteal strengthening, attempt repeated extension in prone with lateral deviation, PA mobilization to L4/L5 region.    PT Home Exercise Plan At eval: Sidelying rotation stretch 3x30, 15x1sec;       Patient will benefit from skilled therapeutic intervention in order to improve the following deficits and impairments:  Abnormal gait, Decreased range of motion, Decreased activity tolerance, Decreased mobility, Decreased strength,  Postural dysfunction, Difficulty walking, Hypermobility, Obesity, Pain, Increased muscle spasms, Improper body mechanics  Visit Diagnosis: Acute left-sided low back pain with left-sided sciatica  Difficulty in walking, not elsewhere classified  Muscle weakness (generalized)     Problem List There are no active problems to display for this patient.   [address] LPTA; [place] CAldona Lento2/27/2018, 5:22 PM  [place] [address]Phone: [phone]  Fax:  [phone] Name: Alex PILSONMRN: [id number] of Birth: [birth year]

## 2016-11-03 ENCOUNTER — Ambulatory Visit (HOSPITAL_COMMUNITY): Payer: BLUE CROSS/BLUE SHIELD

## 2016-11-03 ENCOUNTER — Telehealth (HOSPITAL_COMMUNITY): Payer: Self-pay

## 2016-11-03 NOTE — Telephone Encounter (Signed)
Patient did not show for scheduled appointment. PT contacted patient who reports he thought his appointment was scheduled for 16:00 rather than 14:30. He confirmed he will be at appointment tomorrow. This is the patient's second no-show since starting PT.     2:48 PM, 11/03/16 Rosamaria LintsAllan C Buccola, PT, DPT Physical Therapist [facility] [phone] (office)

## 2016-11-04 ENCOUNTER — Ambulatory Visit (HOSPITAL_COMMUNITY): Payer: BLUE CROSS/BLUE SHIELD | Attending: Orthopaedic Surgery

## 2016-11-04 DIAGNOSIS — M6281 Muscle weakness (generalized): Secondary | ICD-10-CM | POA: Diagnosis present

## 2016-11-04 DIAGNOSIS — R262 Difficulty in walking, not elsewhere classified: Secondary | ICD-10-CM

## 2016-11-04 DIAGNOSIS — M5442 Lumbago with sciatica, left side: Secondary | ICD-10-CM

## 2016-11-04 NOTE — Therapy (Signed)
[facility] [address] [place], [place], [address] Phone: [phone]   Fax:  [phone]  Physical Therapy Treatment  Patient Details  Name: Graylon GunningJonathon L Kimberley MRN: [id number] Date of Birth: 02-02-1991 Referring Provider: Darreld McleanWayne Keeling   Encounter Date: 11/04/2016      PT End of Session - 11/04/16 1622    Visit Number 3   Number of Visits 18   Date for PT Re-Evaluation 11/17/16   Authorization Type BCBS/ other    Authorization Time Period 10/27/16-11/23/16   PT Start Time 1615   PT Stop Time 1645   PT Time Calculation (min) 30 min   Activity Tolerance Patient tolerated treatment well   Behavior [facility] for tasks assessed/performed      Past Medical History:  Diagnosis Date  . Abscess     Past Surgical History:  Procedure Laterality Date  . I&D EXTREMITY Right 05/11/2014   Procedure: IRRIGATION AND DEBRIDEMENT RIGHT INDEX FINGER;  Surgeon: Betha LoaKevin Kuzma, MD;  [facility];  Service: Orthopedics;  Laterality: Right;    There were no vitals filed for this visit.      Subjective Assessment - 11/04/16 1618    Subjective Pt is doing well today. Pain better overall. Still compliant with HEP. Pt continues to try to avoid prolonged flexed postures.    Currently in Pain? Yes   Pain Score 4   Left posterior hip                          [facility] Adult PT Treatment/Exercise - 11/04/16 0001      Lumbar Exercises: Standing   Other Standing Lumbar Exercises 4" lateral step up and thigh thrust: 2x10 bilat  VC to stay tall     Lumbar Exercises: Supine   Bent Knee Raise 2x10 bilat alteranting    Bridge 10 reps  2x10    Bridge Limitations feet near buttocks and shoulder width      Lumbar Exercises: Quadruped   Opposite Arm/Leg Raise Right arm/Left leg;Left arm/Right leg  2x10 bilat Broken up between arms and legs seperately      -Narrow Stance balance, eyes closed with postural breathing for improved  trunk proprioception: 5 breaths -Gait training 4x50 ft for soft landing, narrow stance and heel to toe gait, correction of Rt trendelenburg by increasingh volitional pronation on Right foot.                PT Short Term Goals - 10/27/16 1519      PT SHORT TERM GOAL #1   Title After 3 weeks patient will demonstrate 3 ways he can manage his LLE symptoms and pain at home, and will report consistent compliance with HEP.    Status New     PT SHORT TERM GOAL #2   Title After 3 weeks patient will demonstrate tolerance of 3MWT averaging gait speed of 1.6243m.s    Status New     PT SHORT TERM GOAL #3   Title After 3 weeks patient will demonstrate improved strength AEB MMT grade improvement of 1/2 grade or higher.    Status New           PT Long Term Goals - 10/27/16 2038      PT LONG TERM GOAL #1   Title After 6 weeks patient will demonstrate improved tolerance to mobility AEB ability to run for 5 minutes without increase in pain.    Status New  PT LONG TERM GOAL #2   Title After 6 weeks patient will demonstrate 5/5 strength in all BLE muscle groups tested at evaluation.    Status New     PT LONG TERM GOAL #3   Title After 6 weeks patient will demonstrate improved lateral hip function AEB single leg stance balance with eyes closed >10 seconds, and eyes open > 60seconds bilat.    Status New               Plan - 11/04/16 1623    Clinical Impression Statement Pt arrived late for session. Time is taken to discuss attendance policy due to 2 prior no-shows, a copy is issued to the patient again, and pt encouraged to schedule a new appointment at the end of each visit per policy. Continued to focus on mild gentle mobility and core stabilization/strengthening today. Pt tolerating session better than previous with improved activity tolrance. No lasting increase in pain at end of session. Pt making progress toward goals overall. No changes to POC at this time. Pt demonstrates  good body awareness and effort to perform stabilization. Noted strength deficits in the Right hip both in quadruped and standing activity.    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    PT Frequency 3x / week   PT Duration 6 weeks   PT Treatment/Interventions Moist Heat;Prosthetic Training;Dry needling;Traction;Gait training;Functional mobility training;Therapeutic exercise;Therapeutic activities;Balance training;Patient/family education;Passive range of motion;Manual techniques;Energy conservation   PT Next Visit Plan Continue with current program: advance ROM as tolerated;  COnsider combining 4ped SLR/SAR as tolerated.    PT Home Exercise Plan At eval: Sidelying rotation stretch 3x30, 15x1sec;    Consulted and Agree with Plan of Care Patient      Patient will benefit from skilled therapeutic intervention in order to improve the following deficits and impairments:  Abnormal gait, Decreased range of motion, Decreased activity tolerance, Decreased mobility, Decreased strength, Postural dysfunction, Difficulty walking, Hypermobility, Obesity, Pain, Increased muscle spasms, Improper body mechanics  Visit Diagnosis: Acute left-sided low back pain with left-sided sciatica  Difficulty in walking, not elsewhere classified  Muscle weakness (generalized)     Problem List There are no active problems to display for this patient.   4:41 PM, 11/04/16 Rosamaria Lints, PT, DPT Physical Therapist at [facility] [phone] (office)     [facility] [address] [place], [place], [address] Phone: [phone]   Fax:  [phone]  Name: ALBARAA SWINGLE MRN: [id number] Date of Birth: 1991-04-18

## 2016-11-08 ENCOUNTER — Encounter (HOSPITAL_COMMUNITY): Payer: BLUE CROSS/BLUE SHIELD | Admitting: Physical Therapy

## 2016-11-10 ENCOUNTER — Encounter (HOSPITAL_COMMUNITY): Payer: BLUE CROSS/BLUE SHIELD

## 2016-11-11 ENCOUNTER — Ambulatory Visit (HOSPITAL_COMMUNITY): Payer: BLUE CROSS/BLUE SHIELD

## 2016-11-11 ENCOUNTER — Telehealth (HOSPITAL_COMMUNITY): Payer: Self-pay

## 2016-11-11 NOTE — Telephone Encounter (Signed)
PT called pt regarding missed appointment this morning. Left VM letting pt know that he missed the appointment and that it was his 3rd no-show. Advised pt to call back and speak with front office regarding future appointments.   Jac CanavanBrooke Powell PT, DPT

## 2016-11-12 ENCOUNTER — Encounter (HOSPITAL_COMMUNITY): Payer: BLUE CROSS/BLUE SHIELD

## 2016-11-15 ENCOUNTER — Encounter (HOSPITAL_COMMUNITY): Payer: BLUE CROSS/BLUE SHIELD

## 2016-11-16 ENCOUNTER — Ambulatory Visit (HOSPITAL_COMMUNITY): Payer: BLUE CROSS/BLUE SHIELD | Admitting: Physical Therapy

## 2016-11-16 ENCOUNTER — Telehealth (HOSPITAL_COMMUNITY): Payer: Self-pay | Admitting: Physical Therapy

## 2016-11-16 NOTE — Telephone Encounter (Signed)
Pt did not show today and was his 2nd consecutive NS.  Called patient, who states he forgot and has had a lot going on.  Reminded of next appt on Wednesday at 4pm and patient reported he would attend this appointment. Reminded patient to call clinic and cancel if he was unable to make his next appt.  Pt will have to schedule per visit at this point due to history of NS Lurena Nidamy B Angellica Maddison, PTA/CLT [phone]

## 2016-11-17 ENCOUNTER — Ambulatory Visit (HOSPITAL_COMMUNITY): Payer: BLUE CROSS/BLUE SHIELD

## 2016-11-17 ENCOUNTER — Encounter (HOSPITAL_COMMUNITY): Payer: BLUE CROSS/BLUE SHIELD

## 2016-11-17 DIAGNOSIS — M5442 Lumbago with sciatica, left side: Secondary | ICD-10-CM

## 2016-11-17 DIAGNOSIS — R262 Difficulty in walking, not elsewhere classified: Secondary | ICD-10-CM

## 2016-11-17 DIAGNOSIS — M6281 Muscle weakness (generalized): Secondary | ICD-10-CM

## 2016-11-17 NOTE — Therapy (Addendum)
PHYSICAL THERAPY DISCHARGE SUMMARY  Visits from Start of Care: 4  Current functional level related to goals / functional outcomes: *see below   Remaining deficits: *see below   Education / Equipment: *see below Plan: Patient agrees to discharge.  Patient goals were not met. Patient is being discharged due to not returning since the last visit.  ?????           7:50 AM, 04/11/17 Etta Grandchild, PT, DPT Physical Therapist - [facility] [phone] (Office)     [facility] [address] [place], [place], [address] Phone: [phone]   Fax:  [phone]  Physical Therapy Treatment  Patient Details  Name: Alex Bird MRN: [id number] Date of Birth: 18-Jul-1991 Referring Provider: Sanjuana Kava  Encounter Date: 11/17/2016      PT End of Session - 11/17/16 1616    Visit Number 4   Number of Visits 18   Date for PT Re-Evaluation 11/17/16  Schedule re-assessment next session with evaluation DPT   Authorization Type [employer]/ other    Authorization Time Period 10/27/16-11/23/16   PT Start Time 1608   PT Stop Time 1648   PT Time Calculation (min) 40 min   Activity Tolerance Patient tolerated treatment well;Patient limited by pain  Pain scale 5-6/10 through session; increased pain sitting initially this session   Behavior During Therapy [facility] for tasks assessed/performed      Past Medical History:  Diagnosis Date  . Abscess     Past Surgical History:  Procedure Laterality Date  . I&D EXTREMITY Right 05/11/2014   Procedure: IRRIGATION AND DEBRIDEMENT RIGHT INDEX FINGER;  Surgeon: Leanora Cover, MD;  Location: [facility];  Service: Orthopedics;  Laterality: Right;    There were no vitals filed for this visit.      Subjective Assessment - 11/17/16 1605    Subjective Pt stated is moving currently and preparing for new job next job, current pain scale 5/10 on lower back.     Pertinent History No history  of prior low back pain.   Patient Stated Goals Fully resolve pain   Currently in Pain? Yes   Pain Score 5    Pain Location Back   Pain Orientation Lower   Pain Descriptors / Indicators Sharp  Sharp pain Lt lower back with throbbing front  Lt LE ending at knee    Pain Type Acute pain   Pain Radiating Towards Lt LE ending at front of knee   Pain Onset More than a month ago   Pain Frequency Intermittent   Aggravating Factors  prolonged sitting, being still, leg lifts during work outs   Pain Relieving Factors seated toe reach, hamstring strech            [facility] PT Assessment - 11/17/16 0001      Assessment   Medical Diagnosis Left LBP with radiculopathy    Referring Provider Sanjuana Kava   Hand Dominance Right   Next MD Visit Unsure, believes sometime in April     Ambulation/Gait   Gait Comments 3MWT in 678 feet= 1.25 m.s              [facility] Adult PT Treatment/Exercise - 11/17/16 0001      Lumbar Exercises: Stretches   Single Knee to Chest Stretch 2 reps;30 seconds   Single Knee to Chest Stretch Limitations pain control   Double Knee to Chest Stretch 30 seconds     Lumbar Exercises: Standing  Other Standing Lumbar Exercises 4" lateral step up and thigh thrust: 2x10 bilat   Other Standing Lumbar Exercises SLS Rt 60", Lt 28' eyes open     Lumbar Exercises: Seated   Hip Flexion on Ball Limitations Educated with towel roll for lumbar support and posture awareness     Lumbar Exercises: Supine   Bent Knee Raise 10 reps;5 seconds   Bent Knee Raise Limitations with UE/LE   Bridge 10 reps   Bridge Limitations 2 sets feet near buttocks and shoulder width      Lumbar Exercises: Quadruped   Single Arm Raise 5 reps   Straight Leg Raise 5 reps   Opposite Arm/Leg Raise Right arm/Left leg;Left arm/Right leg;5 reps;5 seconds   Opposite Arm/Leg Raise Limitations bar across back to improve core activation                  PT Short Term Goals - 11/17/16 1621       PT SHORT TERM GOAL #1   Title After 3 weeks patient will demonstrate 3 ways he can manage his LLE symptoms and pain at home, and will report consistent compliance with HEP.    Baseline 11/17/2016:  Reports of compliance with HEP strengthening/stretches, reports of increased awareness for sitting/standing for long periods of time.     Status Partially Met     PT SHORT TERM GOAL #2   Title After 3 weeks patient will demonstrate tolerance of 3MWT averaging gait speed of 1.23ms    Baseline 11/17/2016: 6772fin 3MWT= 1.25 m.s   Status Achieved     PT SHORT TERM GOAL #3   Title After 3 weeks patient will demonstrate improved strength AEB MMT grade improvement of 1/2 grade or higher.            PT Long Term Goals - 11/17/16 1630      PT LONG TERM GOAL #1   Title After 6 weeks patient will demonstrate improved tolerance to mobility AEB ability to run for 5 minutes without increase in pain.      PT LONG TERM GOAL #2   Title After 6 weeks patient will demonstrate 5/5 strength in all BLE muscle groups tested at evaluation.      PT LONG TERM GOAL #3   Title After 6 weeks patient will demonstrate improved lateral hip function AEB single leg stance balance with eyes closed >10 seconds, and eyes open > 60seconds bilat.                Plan - 11/17/16 1649    Clinical Impression Statement Reviewed importance of compliance with HEP as well as PT session attendance as pt has had 3 sessions over 3 weeks (quite a few no-shows).  Pt verbally understood compliance.  Pt reports he is to begin new job next week that will have a lot of sitting.  Pt educated on benefits of lumbar roll for support as well as tactile cueing for improve posture.  Session focus on improving trunk and gluteal strengthening with therapist facilitation for stabiltiy with exercise.  Progressed to quadruped SAR/SLR for stability strengthening with noted gluteal fatigue due to weakness.  Reviewed goals with reports of compliance  daily, improved awareness of body and ability to change positions for pain control and improved gait tolerance noted today during 3MWT at 1.2521m  Pt does continue to be limited by Lt sided LBP with radicular symptoms to anterior Lt knee (was initially down to ankle).     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    PT Frequency 3x / week   PT Duration 6 weeks   PT Treatment/Interventions Moist Heat;Prosthetic Training;Dry needling;Traction;Gait training;Functional mobility training;Therapeutic exercise;Therapeutic activities;Balance training;Patient/family education;Passive range of motion;Manual techniques;Energy conservation   PT Next Visit Plan Reassess next session/     PT Home Exercise Plan At eval: Sidelying rotation stretch 3x30, 15x1sec;       Patient will benefit from skilled therapeutic intervention in order to improve the following deficits and impairments:  Abnormal gait, Decreased range of motion, Decreased activity tolerance, Decreased mobility, Decreased strength, Postural dysfunction, Difficulty walking, Hypermobility, Obesity, Pain, Increased muscle spasms, Improper body mechanics  Visit Diagnosis: Acute left-sided low back pain with left-sided sciatica  Difficulty in walking, not elsewhere classified  Muscle weakness (generalized)     Problem List There are no active problems to display for this patient.  [address], LPTA; [place]  Aldona Lento 11/17/2016, 5:00 PM  [facility] [address] [place], [place], [address] Phone: [phone]   Fax:  [phone]  Name: CASSIE HENKELS MRN: [id number] Date of Birth: 21-Oct-1990

## 2016-11-19 ENCOUNTER — Encounter (HOSPITAL_COMMUNITY): Payer: BLUE CROSS/BLUE SHIELD

## 2016-11-19 ENCOUNTER — Ambulatory Visit (HOSPITAL_COMMUNITY): Payer: BLUE CROSS/BLUE SHIELD | Admitting: Physical Therapy

## 2016-11-22 ENCOUNTER — Encounter (HOSPITAL_COMMUNITY): Payer: BLUE CROSS/BLUE SHIELD

## 2016-11-22 ENCOUNTER — Encounter (HOSPITAL_COMMUNITY): Payer: BLUE CROSS/BLUE SHIELD | Admitting: Physical Therapy

## 2016-11-24 ENCOUNTER — Encounter (HOSPITAL_COMMUNITY): Payer: BLUE CROSS/BLUE SHIELD

## 2016-11-24 ENCOUNTER — Encounter (HOSPITAL_COMMUNITY): Payer: BLUE CROSS/BLUE SHIELD | Admitting: Physical Therapy

## 2016-11-26 ENCOUNTER — Encounter (HOSPITAL_COMMUNITY): Payer: BLUE CROSS/BLUE SHIELD

## 2016-11-29 ENCOUNTER — Encounter (HOSPITAL_COMMUNITY): Payer: BLUE CROSS/BLUE SHIELD | Admitting: Physical Therapy

## 2016-11-29 ENCOUNTER — Encounter (HOSPITAL_COMMUNITY): Payer: BLUE CROSS/BLUE SHIELD

## 2016-12-01 ENCOUNTER — Encounter (HOSPITAL_COMMUNITY): Payer: BLUE CROSS/BLUE SHIELD

## 2016-12-01 ENCOUNTER — Telehealth (HOSPITAL_COMMUNITY): Payer: Self-pay | Admitting: General Practice

## 2016-12-01 ENCOUNTER — Ambulatory Visit (HOSPITAL_COMMUNITY): Payer: BLUE CROSS/BLUE SHIELD

## 2016-12-01 ENCOUNTER — Encounter (HOSPITAL_COMMUNITY): Payer: BLUE CROSS/BLUE SHIELD | Admitting: Physical Therapy

## 2016-12-01 NOTE — Telephone Encounter (Signed)
12/01/16 never heard from patient so I called again and left a 2nd message that the therapist he was scheduled with was not here today.  It was a possible d/c today and if he wanted to reschedule to please call us back.

## 2016-12-01 NOTE — Telephone Encounter (Signed)
12/01/16 left a message to try and reschedule appt.

## 2016-12-03 ENCOUNTER — Encounter (HOSPITAL_COMMUNITY): Payer: BLUE CROSS/BLUE SHIELD

## 2016-12-06 ENCOUNTER — Encounter (HOSPITAL_COMMUNITY): Payer: BLUE CROSS/BLUE SHIELD | Admitting: Physical Therapy

## 2016-12-08 ENCOUNTER — Encounter (HOSPITAL_COMMUNITY): Payer: BLUE CROSS/BLUE SHIELD | Admitting: Physical Therapy

## 2016-12-09 ENCOUNTER — Encounter (HOSPITAL_COMMUNITY): Payer: BLUE CROSS/BLUE SHIELD | Admitting: Physical Therapy

## 2016-12-09 ENCOUNTER — Ambulatory Visit: Payer: BLUE CROSS/BLUE SHIELD | Admitting: Orthopaedic Surgery

## 2016-12-15 ENCOUNTER — Ambulatory Visit: Payer: BLUE CROSS/BLUE SHIELD | Admitting: Orthopaedic Surgery

## 2016-12-15 ENCOUNTER — Encounter: Payer: Self-pay | Admitting: Orthopaedic Surgery
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
## 2023-10-31 ENCOUNTER — Other Ambulatory Visit (HOSPITAL_COMMUNITY): Payer: Self-pay | Admitting: Physician Assistant

## 2023-10-31 DIAGNOSIS — K760 Fatty (change of) liver, not elsewhere classified: Secondary | ICD-10-CM

## 2023-11-10 ENCOUNTER — Ambulatory Visit (HOSPITAL_COMMUNITY)
Admission: RE | Admit: 2023-11-10 | Discharge: 2023-11-10 | Disposition: A | Discharge status: Home / Self Care | Payer: Managed Care, Other (non HMO) | Source: Ambulatory Visit | Attending: Physician Assistant | Admitting: Physician Assistant

## 2023-11-10 DIAGNOSIS — K760 Fatty (change of) liver, not elsewhere classified: Secondary | ICD-10-CM | POA: Diagnosis present

## 2023-11-10 MED ORDER — IOHEXOL 350 MG/ML SOLN
75.0000 mL | Freq: Once | INTRAVENOUS | Status: AC | PRN
  Administered 2023-11-10: 75 mL via INTRAVENOUS
